# Patient Record
Sex: Female | Born: 1990 | Race: White | Hispanic: No | Marital: Married | Smoking: Never smoker
Health system: Southern US, Community
[De-identification: ages and names within clinical notes are randomized; demographics above are authoritative.]

## PROBLEM LIST (undated history)

## (undated) DIAGNOSIS — Z789 Other specified health status: Secondary | ICD-10-CM

## (undated) DIAGNOSIS — F419 Anxiety disorder, unspecified: Secondary | ICD-10-CM

## (undated) DIAGNOSIS — F909 Attention-deficit hyperactivity disorder, unspecified type: Secondary | ICD-10-CM

## (undated) HISTORY — PX: CYSTECTOMY: SUR359

## (undated) HISTORY — PX: BREAST SURGERY: SHX581

## (undated) HISTORY — DX: Anxiety disorder, unspecified: F41.9

## (undated) HISTORY — PX: WISDOM TOOTH EXTRACTION: SHX21

## (undated) HISTORY — PX: COSMETIC SURGERY: SHX468

## (undated) HISTORY — DX: Other specified health status: Z78.9

## (undated) HISTORY — PX: LASIK: SHX215

---

## 2015-12-20 LAB — OB RESULTS CONSOLE GC/CHLAMYDIA
Chlamydia: NEGATIVE
Gonorrhea: NEGATIVE

## 2015-12-20 LAB — OB RESULTS CONSOLE ABO/RH: RH TYPE: NEGATIVE

## 2015-12-20 LAB — OB RESULTS CONSOLE HEPATITIS B SURFACE ANTIGEN: HEP B S AG: NEGATIVE

## 2015-12-20 LAB — OB RESULTS CONSOLE ANTIBODY SCREEN: ANTIBODY SCREEN: NEGATIVE

## 2015-12-20 LAB — OB RESULTS CONSOLE RPR: RPR: NONREACTIVE

## 2015-12-20 LAB — OB RESULTS CONSOLE RUBELLA ANTIBODY, IGM: RUBELLA: IMMUNE

## 2015-12-20 LAB — OB RESULTS CONSOLE HIV ANTIBODY (ROUTINE TESTING): HIV: NONREACTIVE

## 2016-02-09 ENCOUNTER — Inpatient Hospital Stay (HOSPITAL_COMMUNITY)
Admission: AD | Admit: 2016-02-09 | Payer: No Typology Code available for payment source | Source: Ambulatory Visit | Admitting: Obstetrics and Gynecology

## 2016-03-27 LAB — OB RESULTS CONSOLE GBS: STREP GROUP B AG: NEGATIVE

## 2016-04-10 NOTE — L&D Delivery Note (Signed)
Delivery Note At 10:46 PM a healthy female was delivered via Vaginal, Spontaneous Delivery (Presentation: Occiput left ant).  APGAR: 9, 9; weight pending.   Placenta status: complete, 3 Vs.  Cord:  Normal with the following complications: None.  Cord pH: N/A  Anesthesia:   Episiotomy: None Lacerations: 1st degree; Bilateral vulvar Suture Repair: 2.0 3.0 vicryl rapide Est. Blood Loss (mL):  300  Mom to postpartum.  Baby to Couplet care / Skin to Skin.  Marie-Lyne Asiana Benninger 04/22/2016, 11:23 PM

## 2016-04-12 ENCOUNTER — Other Ambulatory Visit: Payer: Self-pay | Admitting: Obstetrics and Gynecology

## 2016-04-17 ENCOUNTER — Telehealth (HOSPITAL_COMMUNITY): Payer: Self-pay | Admitting: *Deleted

## 2016-04-17 ENCOUNTER — Encounter (HOSPITAL_COMMUNITY): Payer: Self-pay | Admitting: *Deleted

## 2016-04-17 NOTE — Telephone Encounter (Signed)
Preadmission screen  

## 2016-04-18 ENCOUNTER — Encounter (HOSPITAL_COMMUNITY): Payer: Self-pay | Admitting: *Deleted

## 2016-04-22 ENCOUNTER — Inpatient Hospital Stay (HOSPITAL_COMMUNITY)
Admission: AD | Admit: 2016-04-22 | Discharge: 2016-04-24 | DRG: 775 | Disposition: A | Discharge status: Home / Self Care | Payer: Managed Care, Other (non HMO) | Source: Ambulatory Visit | Attending: Obstetrics & Gynecology | Admitting: Obstetrics & Gynecology

## 2016-04-22 ENCOUNTER — Inpatient Hospital Stay (HOSPITAL_COMMUNITY): Payer: Managed Care, Other (non HMO) | Admitting: Anesthesiology

## 2016-04-22 ENCOUNTER — Encounter (HOSPITAL_COMMUNITY): Payer: Self-pay | Admitting: *Deleted

## 2016-04-22 DIAGNOSIS — Z3A40 40 weeks gestation of pregnancy: Secondary | ICD-10-CM

## 2016-04-22 DIAGNOSIS — Z3493 Encounter for supervision of normal pregnancy, unspecified, third trimester: Secondary | ICD-10-CM | POA: Diagnosis present

## 2016-04-22 LAB — CBC
HEMATOCRIT: 36.2 % (ref 36.0–46.0)
Hemoglobin: 12.7 g/dL (ref 12.0–15.0)
MCH: 30.4 pg (ref 26.0–34.0)
MCHC: 35.1 g/dL (ref 30.0–36.0)
MCV: 86.6 fL (ref 78.0–100.0)
Platelets: 222 10*3/uL (ref 150–400)
RBC: 4.18 MIL/uL (ref 3.87–5.11)
RDW: 13 % (ref 11.5–15.5)
WBC: 19.3 10*3/uL — ABNORMAL HIGH (ref 4.0–10.5)

## 2016-04-22 MED ORDER — SOD CITRATE-CITRIC ACID 500-334 MG/5ML PO SOLN: 30.0000 mL | ORAL | Status: DC | PRN

## 2016-04-22 MED ORDER — LIDOCAINE HCL (PF) 1 % IJ SOLN
30.0000 mL | INTRAMUSCULAR | Status: DC | PRN
  Administered 2016-04-22: 30 mL via SUBCUTANEOUS
  Filled 2016-04-22: qty 30

## 2016-04-22 MED ORDER — ONDANSETRON HCL 4 MG/2ML IJ SOLN: 4.0000 mg | Freq: Four times a day (QID) | INTRAMUSCULAR | Status: DC | PRN

## 2016-04-22 MED ORDER — OXYTOCIN 40 UNITS IN LACTATED RINGERS INFUSION - SIMPLE MED
1.0000 m[IU]/min | INTRAVENOUS | Status: DC
  Administered 2016-04-22: 2 m[IU]/min via INTRAVENOUS

## 2016-04-22 MED ORDER — IBUPROFEN 600 MG PO TABS
600.0000 mg | ORAL_TABLET | Freq: Four times a day (QID) | ORAL | Status: DC
  Administered 2016-04-23 – 2016-04-24 (×7): 600 mg via ORAL
  Filled 2016-04-22 (×7): qty 1

## 2016-04-22 MED ORDER — LACTATED RINGERS IV SOLN: 500.0000 mL | Freq: Once | INTRAVENOUS | Status: DC

## 2016-04-22 MED ORDER — LACTATED RINGERS IV SOLN
INTRAVENOUS | Status: DC
  Administered 2016-04-22 (×2): via INTRAVENOUS

## 2016-04-22 MED ORDER — LIDOCAINE HCL (PF) 1 % IJ SOLN
INTRAMUSCULAR | Status: DC | PRN
  Administered 2016-04-22 (×2): 5 mL via EPIDURAL

## 2016-04-22 MED ORDER — EPHEDRINE 5 MG/ML INJ: 10.0000 mg | INTRAVENOUS | Status: DC | PRN

## 2016-04-22 MED ORDER — PHENYLEPHRINE 40 MCG/ML (10ML) SYRINGE FOR IV PUSH (FOR BLOOD PRESSURE SUPPORT)
80.0000 ug | PREFILLED_SYRINGE | INTRAVENOUS | Status: DC | PRN
  Filled 2016-04-22: qty 10

## 2016-04-22 MED ORDER — LACTATED RINGERS IV SOLN
500.0000 mL | INTRAVENOUS | Status: DC | PRN
  Administered 2016-04-22: 500 mL via INTRAVENOUS

## 2016-04-22 MED ORDER — DIPHENHYDRAMINE HCL 50 MG/ML IJ SOLN: 12.5000 mg | INTRAMUSCULAR | Status: DC | PRN

## 2016-04-22 MED ORDER — FLEET ENEMA 7-19 GM/118ML RE ENEM: 1.0000 | ENEMA | RECTAL | Status: DC | PRN

## 2016-04-22 MED ORDER — OXYCODONE-ACETAMINOPHEN 5-325 MG PO TABS: 1.0000 | ORAL_TABLET | ORAL | Status: DC | PRN

## 2016-04-22 MED ORDER — FENTANYL 2.5 MCG/ML BUPIVACAINE 1/10 % EPIDURAL INFUSION (WH - ANES)
14.0000 mL/h | INTRAMUSCULAR | Status: DC | PRN
  Administered 2016-04-22 (×2): 14 mL/h via EPIDURAL
  Filled 2016-04-22 (×2): qty 100

## 2016-04-22 MED ORDER — OXYCODONE-ACETAMINOPHEN 5-325 MG PO TABS
2.0000 | ORAL_TABLET | ORAL | Status: DC | PRN
Start: 2016-04-22 — End: 2016-04-23

## 2016-04-22 MED ORDER — TERBUTALINE SULFATE 1 MG/ML IJ SOLN: 0.2500 mg | Freq: Once | INTRAMUSCULAR | Status: DC | PRN

## 2016-04-22 MED ORDER — OXYTOCIN BOLUS FROM INFUSION
500.0000 mL | Freq: Once | INTRAVENOUS | Status: AC
  Administered 2016-04-22: 500 mL via INTRAVENOUS

## 2016-04-22 MED ORDER — OXYTOCIN 40 UNITS IN LACTATED RINGERS INFUSION - SIMPLE MED
2.5000 [IU]/h | INTRAVENOUS | Status: DC
  Filled 2016-04-22: qty 1000

## 2016-04-22 MED ORDER — ACETAMINOPHEN 325 MG PO TABS: 650.0000 mg | ORAL_TABLET | ORAL | Status: DC | PRN

## 2016-04-22 NOTE — H&P (Signed)
Loretha BrasilMakayla Tolles is a 26 y.o. female G2P0010 3470w2d presenting for regular painful UCs.  HPP/HPI:  [facility].  UCs reg, intense.  No AF leak, no vaginal bleeding.  Good FMs.  No PEC Sx.  OB History    Gravida Para Term Preterm AB Living   2 0     1     SAB TAB Ectopic Multiple Live Births     1           Past Medical History:  Diagnosis Date  . Medical history non-contributory    Past Surgical History:  Procedure Laterality Date  . BREAST SURGERY     augmentation  . CYSTECTOMY     from throat  . WISDOM TOOTH EXTRACTION     Family History: family history is not on file. Social History:  reports that she has never smoked. She has never used smokeless tobacco. She reports that she does not drink alcohol or use drugs.  No Known Allergies  Dilation: (P) 6 Effacement (%): (P) 100 Station: (P) -1 Exam by:: (P) Pheonix Wisby  Left Occiput post AROM AF clear  Blood pressure 109/69, pulse 84, temperature 98.4 F (36.9 C), temperature source Axillary, resp. rate 18, height 5\' 6"  (1.676 m), weight 228 lb 0.6 oz (103.4 kg), last menstrual period 07/15/2015, SpO2 100 %. Exam Physical Exam   FHR 130's with good variability, many accelerations, no deceleration. UCs q2-3 min   HPP:  Patient Active Problem List   Diagnosis Date Noted  . Indication for care in labor or delivery 04/22/2016    Prenatal labs: ABO, Rh: O/Negative/-- (09/11 0000) Antibody: Negative (09/11 0000) Rubella: Immune RPR: Nonreactive (09/11 0000)  HBsAg: Negative (09/11 0000)  HIV: Non-reactive (09/11 0000)  Genetic testing:  Declined US anato: wnl, female fetus 1 hr GTT: wnl GBS: Negative (12/18 0000)   Assessment/Plan: 40+ wks G2P0A1 in active labor.  FHR Cat 1.  Comfortable under Epidural. Expectant management towards probable vaginal delivery.   Marie-Lyne Luwanda Starr 04/22/2016, 4:15 PM

## 2016-04-22 NOTE — Anesthesia Pain Management Evaluation Note (Signed)
  CRNA Pain Management Visit Note  Patient: Rachel Oliver, 26 y.o., female  "Hello I am a member of the anesthesia team at [facility]. We have an anesthesia team available at all times to provide care throughout the hospital, including epidural management and anesthesia for C-section. I don't know your plan for the delivery whether it a natural birth, water birth, IV sedation, nitrous supplementation, doula or epidural, but we want to meet your pain goals."   1.Was your pain managed to your expectations on prior hospitalizations?   No prior hospitalizations  2.What is your expectation for pain management during this hospitalization?     Epidural  3.How can we help you reach that goal? Epidural in place  Record the patient's initial score and the patient's pain goal.   Pain: 1  Pain Goal: 8 [facility] wants you to be able to say your pain was always managed very well.  Pressley Barsky 04/22/2016

## 2016-04-22 NOTE — Anesthesia Preprocedure Evaluation (Signed)
Anesthesia Evaluation  Patient identified by MRN, date of birth, ID band Patient awake    Reviewed: Allergy & Precautions, NPO status , Patient's Chart, lab work & pertinent test results  History of Anesthesia Complications Negative for: history of anesthetic complications  Airway Mallampati: II  TM Distance: >3 FB Neck ROM: Full    Dental no notable dental hx. (+) Dental Advisory Given   Pulmonary neg pulmonary ROS,    Pulmonary exam normal        Cardiovascular negative cardio ROS Normal cardiovascular exam     Neuro/Psych negative neurological ROS  negative psych ROS   GI/Hepatic negative GI ROS, Neg liver ROS,   Endo/Other  negative endocrine ROS  Renal/GU negative Renal ROS  negative genitourinary   Musculoskeletal negative musculoskeletal ROS (+)   Abdominal   Peds negative pediatric ROS (+)  Hematology negative hematology ROS (+)   Anesthesia Other Findings   Reproductive/Obstetrics (+) Pregnancy                             Anesthesia Physical Anesthesia Plan  ASA: II  Anesthesia Plan: Epidural   Post-op Pain Management:    Induction:   Airway Management Planned: Natural Airway and Simple Face Mask  Additional Equipment:   Intra-op Plan:   Post-operative Plan:   Informed Consent: I have reviewed the patients History and Physical, chart, labs and discussed the procedure including the risks, benefits and alternatives for the proposed anesthesia with the patient or authorized representative who has indicated his/her understanding and acceptance.   Dental advisory given  Plan Discussed with: Anesthesiologist  Anesthesia Plan Comments:         Anesthesia Quick Evaluation

## 2016-04-22 NOTE — MAU Note (Signed)
Patient presents with contractions all day was 3 cm on Tuesday.

## 2016-04-22 NOTE — Anesthesia Procedure Notes (Signed)
Epidural Patient location during procedure: OB Start time: 04/22/2016 3:26 PM End time: 04/22/2016 3:36 PM  Staffing Anesthesiologist: Heather RobertsSINGER, Angelic Schnelle Performed: anesthesiologist   Preanesthetic Checklist Completed: patient identified, site marked, pre-op evaluation, timeout performed, IV checked, risks and benefits discussed and monitors and equipment checked  Epidural Patient position: sitting Prep: DuraPrep Patient monitoring: heart rate, cardiac monitor, continuous pulse ox and blood pressure Approach: midline Location: L2-L3 Injection technique: LOR saline  Needle:  Needle type: Tuohy  Needle gauge: 17 G Needle length: 9 cm Needle insertion depth: 7 cm Catheter size: 20 Guage Catheter at skin depth: 12 cm Test dose: negative and Other  Assessment Events: blood not aspirated, injection not painful, no injection resistance and negative IV test  Additional Notes Informed consent obtained prior to proceeding including risk of failure, 1% risk of PDPH, risk of minor discomfort and bruising.  Discussed rare but serious complications including epidural abscess, permanent nerve injury, epidural hematoma.  Discussed alternatives to epidural analgesia and patient desires to proceed.  Timeout performed pre-procedure verifying patient name, procedure, and platelet count.  Patient tolerated procedure well.

## 2016-04-23 ENCOUNTER — Inpatient Hospital Stay (HOSPITAL_COMMUNITY): Admission: RE | Admit: 2016-04-23 | Payer: No Typology Code available for payment source | Source: Ambulatory Visit

## 2016-04-23 ENCOUNTER — Encounter (HOSPITAL_COMMUNITY): Payer: Self-pay

## 2016-04-23 LAB — CBC
HEMATOCRIT: 32.5 % — AB (ref 36.0–46.0)
Hemoglobin: 11.3 g/dL — ABNORMAL LOW (ref 12.0–15.0)
MCH: 30.3 pg (ref 26.0–34.0)
MCHC: 34.8 g/dL (ref 30.0–36.0)
MCV: 87.1 fL (ref 78.0–100.0)
Platelets: 213 10*3/uL (ref 150–400)
RBC: 3.73 MIL/uL — ABNORMAL LOW (ref 3.87–5.11)
RDW: 13.1 % (ref 11.5–15.5)
WBC: 27.1 10*3/uL — ABNORMAL HIGH (ref 4.0–10.5)

## 2016-04-23 LAB — RPR: RPR: NONREACTIVE

## 2016-04-23 MED ORDER — ZOLPIDEM TARTRATE 5 MG PO TABS: 5.0000 mg | ORAL_TABLET | Freq: Every evening | ORAL | Status: DC | PRN

## 2016-04-23 MED ORDER — SENNOSIDES-DOCUSATE SODIUM 8.6-50 MG PO TABS
2.0000 | ORAL_TABLET | ORAL | Status: DC
  Administered 2016-04-23 (×2): 2 via ORAL
  Filled 2016-04-23 (×2): qty 2

## 2016-04-23 MED ORDER — WITCH HAZEL-GLYCERIN EX PADS
1.0000 "application " | MEDICATED_PAD | CUTANEOUS | Status: DC | PRN
  Administered 2016-04-23: 1 via TOPICAL

## 2016-04-23 MED ORDER — PRENATAL MULTIVITAMIN CH
1.0000 | ORAL_TABLET | Freq: Every day | ORAL | Status: DC
  Administered 2016-04-23 – 2016-04-24 (×2): 1 via ORAL
  Filled 2016-04-23 (×2): qty 1

## 2016-04-23 MED ORDER — BENZOCAINE-MENTHOL 20-0.5 % EX AERO
1.0000 "application " | INHALATION_SPRAY | CUTANEOUS | Status: DC | PRN
  Administered 2016-04-23: 1 via TOPICAL
  Filled 2016-04-23 (×2): qty 56

## 2016-04-23 MED ORDER — TETANUS-DIPHTH-ACELL PERTUSSIS 5-2.5-18.5 LF-MCG/0.5 IM SUSP: 0.5000 mL | Freq: Once | INTRAMUSCULAR | Status: DC

## 2016-04-23 MED ORDER — COCONUT OIL OIL: 1.0000 "application " | TOPICAL_OIL | Status: DC | PRN

## 2016-04-23 MED ORDER — RHO D IMMUNE GLOBULIN 1500 UNIT/2ML IJ SOSY
300.0000 ug | PREFILLED_SYRINGE | Freq: Once | INTRAMUSCULAR | Status: DC
  Filled 2016-04-23: qty 2

## 2016-04-23 MED ORDER — DIPHENHYDRAMINE HCL 25 MG PO CAPS: 25.0000 mg | ORAL_CAPSULE | Freq: Four times a day (QID) | ORAL | Status: DC | PRN

## 2016-04-23 MED ORDER — DIBUCAINE 1 % RE OINT: 1.0000 "application " | TOPICAL_OINTMENT | RECTAL | Status: DC | PRN

## 2016-04-23 MED ORDER — OXYTOCIN 40 UNITS IN LACTATED RINGERS INFUSION - SIMPLE MED: 2.5000 [IU]/h | INTRAVENOUS | Status: DC | PRN

## 2016-04-23 MED ORDER — ACETAMINOPHEN 325 MG PO TABS
650.0000 mg | ORAL_TABLET | ORAL | Status: DC | PRN
  Administered 2016-04-23: 650 mg via ORAL
  Filled 2016-04-23: qty 2

## 2016-04-23 MED ORDER — ONDANSETRON HCL 4 MG/2ML IJ SOLN
4.0000 mg | INTRAMUSCULAR | Status: DC | PRN
Start: 2016-04-23 — End: 2016-04-24

## 2016-04-23 MED ORDER — SIMETHICONE 80 MG PO CHEW: 80.0000 mg | CHEWABLE_TABLET | ORAL | Status: DC | PRN

## 2016-04-23 MED ORDER — ONDANSETRON HCL 4 MG PO TABS: 4.0000 mg | ORAL_TABLET | ORAL | Status: DC | PRN

## 2016-04-23 MED ORDER — INFLUENZA VAC SPLIT QUAD 0.5 ML IM SUSY
0.5000 mL | PREFILLED_SYRINGE | INTRAMUSCULAR | Status: AC
  Administered 2016-04-24: 0.5 mL via INTRAMUSCULAR

## 2016-04-23 NOTE — Progress Notes (Signed)
Patient ID: Rachel BrasilMakayla Oliver, female   DOB: 12/05/1990, 26 y.o.   MRN: [id number] PPD # 1 SVD with 1st Degree Perineal Laceration  S:  Reports feeling well.             Tolerating po/ No nausea or vomiting             Bleeding is light             Pain controlled with ibuprofen (OTC)             Up ad lib / ambulatory / voiding without difficulties     Information for the patient's newborn:  Malka SoHall, Boy Malini [[id number]][[id number]]  female "Carsten"  breast feeding  / Circumcision planning   O:  A & O x 3, in no apparent distress              VS:  Vitals:   04/23/16 0000 04/23/16 0025 04/23/16 0126 04/23/16 0534  BP: (!) 126/45 (!) 116/57 126/61 (!) 117/58  Pulse: 84 77 88 69  Resp:  18 18 18   Temp:  98.3 F (36.8 C) 98 F (36.7 C) 97.6 F (36.4 C)  TempSrc:  Oral Oral Oral  SpO2:  98% 97% 100%  Weight:      Height:        LABS:  Recent Labs  04/22/16 1448 04/23/16 0515  WBC 19.3* 27.1*  HGB 12.7 11.3*  HCT 36.2 32.5*  PLT 222 213    Blood type: O/Negative/-- (09/11 0000)  Rubella: Immune (09/11 0000)   I&O: I/O last 3 completed shifts: In: -  Out: 600 [Urine:300; Blood:300]          No intake/output data recorded.    Abdomen: soft, non-tender, non-distended             Fundus: firm, non-tender, U-1  Perineum: 1st degree repair healing well, mild edema  Lochia: minimal  Extremities: No edema, no calf pain or tenderness    A/P: PPD # 1 26 y.o., [id number]   Principal Problem:   Postpartum care following vaginal delivery (1/13) Active Problems:   Indication for care in labor or delivery   Postpartum state   First degree perineal laceration during delivery    Doing well - stable status  Routine post partum orders  Anticipate discharge tomorrow    Raelyn MoraAWSON, Jalene Lacko, M, MSN, CNM 04/23/2016, 9:53 AM

## 2016-04-23 NOTE — Lactation Note (Signed)
This note was copied from a baby's chart. Lactation Consultation Note; Initial visit with mom. She reports baby has had a couple of feedings- did hurt some with latch. Reports she is able to hand express Colostrum and did spoon feed early this morning. Baby asleep in visitors arms. Several visitors present.  Reviewed basic teaching.  Encouraged to page for assist prn. Bf brochure given. Reviewed our phone number, OP appointments and BFSG as resources for support after DC. No questions at present  Patient Name: Rachel Loretha BrasilMakayla Shock [id number]'s Date: 04/23/2016 Reason for consult: Initial assessment   Maternal Data Formula Feeding for Exclusion: No Has patient been taught Hand Expression?: Yes Does the patient have breastfeeding experience prior to this delivery?: No  Feeding Feeding Type: Breast Fed Length of feed: 15 min  LATCH Score/Interventions Latch: Grasps breast easily, tongue down, lips flanged, rhythmical sucking. Intervention(s): Adjust position;Assist with latch;Breast compression;Breast massage  Audible Swallowing: A few with stimulation Intervention(s): Skin to skin;Hand expression  Type of Nipple: Everted at rest and after stimulation  Comfort (Breast/Nipple): Soft / non-tender     Hold (Positioning): Assistance needed to correctly position infant at breast and maintain latch.  LATCH Score: 8  Lactation Tools Discussed/Used     Consult Status Consult Status: Follow-up Date: 04/24/16 Follow-up type: In-patient    Rachel Oliver, Rachel Oliver D 04/23/2016, 1:16 PM

## 2016-04-23 NOTE — Anesthesia Postprocedure Evaluation (Signed)
Anesthesia Post Note  Patient: Rachel Oliver  Procedure(s) Performed: * No procedures listed *  Patient location during evaluation: Mother Baby Anesthesia Type: Epidural Level of consciousness: awake Pain management: satisfactory to patient Vital Signs Assessment: post-procedure vital signs reviewed and stable Respiratory status: spontaneous breathing Cardiovascular status: stable Anesthetic complications: no        Last Vitals:  Vitals:   04/23/16 0126 04/23/16 0534  BP: 126/61 (!) 117/58  Pulse: 88 69  Resp: 18 18  Temp: 36.7 C 36.4 C    Last Pain:  Vitals:   04/23/16 0715  TempSrc:   PainSc: 0-No pain   Pain Goal: Patients Stated Pain Goal: 0 (04/22/16 1359)               Cephus ShellingBURGER,Rosalynn Sergent

## 2016-04-23 NOTE — Progress Notes (Signed)
CSW acknowledges consult. This Clinical research associatewriter attempted to meet with MOB at bedside to complete assessment for consult regarding hx of depression; however, upon this writer's arrival, MOB had several visitors in the room preventing adequate privacy for assessment to be completed. CSW will attempt to meet with MOB at a later time.   Cosme Jacob, MSW, LCSW-A Clinical Social Worker   [facility]  Office: [phone]

## 2016-04-23 NOTE — Anesthesia Postprocedure Evaluation (Addendum)
Anesthesia Post Note  Patient: Rachel Oliver  Procedure(s) Performed: * No procedures listed *  Patient location during evaluation: Mother Baby Anesthesia Type: Epidural Level of consciousness: awake Pain management: pain level controlled Vital Signs Assessment: post-procedure vital signs reviewed and stable Respiratory status: spontaneous breathing Cardiovascular status: stable Postop Assessment: no headache, no backache, epidural receding, patient able to bend at knees, no signs of nausea or vomiting and adequate PO intake Anesthetic complications: no        Last Vitals:  Vitals:   04/23/16 0534 04/23/16 1146  BP: (!) 117/58 100/67  Pulse: 69 81  Resp: 18 17  Temp: 36.4 C 36.5 C    Last Pain:  Vitals:   04/23/16 1146  TempSrc: Axillary  PainSc: 0-No pain   Pain Goal: Patients Stated Pain Goal: 0 (04/22/16 1359)               MULLINS,JANET

## 2016-04-24 LAB — CBC
HCT: 36.4 % (ref 36.0–46.0)
Hemoglobin: 12.5 g/dL (ref 12.0–15.0)
MCH: 30.3 pg (ref 26.0–34.0)
MCHC: 34.3 g/dL (ref 30.0–36.0)
MCV: 88.3 fL (ref 78.0–100.0)
Platelets: 220 10*3/uL (ref 150–400)
RBC: 4.12 MIL/uL (ref 3.87–5.11)
RDW: 13.4 % (ref 11.5–15.5)
WBC: 18.6 10*3/uL — ABNORMAL HIGH (ref 4.0–10.5)

## 2016-04-24 MED ORDER — IBUPROFEN 600 MG PO TABS: 600.0000 mg | ORAL_TABLET | Freq: Four times a day (QID) | ORAL | 0 refills | Status: DC

## 2016-04-24 NOTE — Progress Notes (Signed)
PPD 2 SVD with 1st degree repair  S:  Reports feeling ok - no fever or URI symptoms              Wants flu shot             Tolerating po/ No nausea or vomiting             Bleeding is light             Pain controlled with motrin             Up ad lib / ambulatory / voiding QS  Newborn breast feeding   O:               VS: BP (!) 125/54 (BP Location: Left Arm)   Pulse (!) 50   Temp 98.2 F (36.8 C) (Oral)   Resp 18   Ht 5\' 6"  (1.676 m)   Wt 103.4 kg (228 lb 0.6 oz)   LMP 07/15/2015   SpO2 100%   Breastfeeding? Unknown   BMI 36.81 kg/m    LABS:              Recent Labs  04/22/16 1448 04/23/16 0515  WBC 19.3* 27.1*  HGB 12.7 11.3*  PLT 222 213               Blood type: O/Negative/-- (09/11 0000) / newborn O negative (rhogam not indicated)  Rubella: Immune (09/11 0000)                            Physical Exam:             Alert and oriented X3  Abdomen: soft, non-tender, non-distended              Fundus: firm, non-tender, Ueven  Perineum: no edema  Lochia: light  Extremities:  No edema, no calf pain or tenderness    A: PPD # 2 SVD with 1st degree repair             Leukocytosis - afebrile  Doing well - stable status  P: Routine post partum orders             Recheck CBC prior to DC             Recommend 1 week prior to flu vaccine - precautions reviewed to prevent flu exposure    Marlinda MikeBAILEY, Rachel Oliver CNM, MSN, [facility] 04/24/2016, 9:01 AM

## 2016-04-24 NOTE — Discharge Summary (Signed)
Obstetric Discharge Summary Reason for Admission: onset of labor Prenatal Procedures: none Intrapartum Procedures: spontaneous vaginal delivery Postpartum Procedures: none Complications-Operative and Postpartum: 1st degree perineal laceration Hemoglobin  Date Value Ref Range Status  04/24/2016 12.5 12.0 - 15.0 g/dL Final   HCT  Date Value Ref Range Status  04/24/2016 36.4 36.0 - 46.0 % Final    Physical Exam:  General: alert, cooperative and no distress Lochia: appropriate Uterine Fundus: firm Incision: healing well DVT Evaluation: No evidence of DVT seen on physical exam.  Discharge Diagnoses: Term Pregnancy-delivered  Discharge Information: Date: 04/24/2016 Activity: pelvic rest Diet: routine Medications: PNV and Ibuprofen Condition: stable Instructions: refer to practice specific booklet Discharge to: home Follow-up Information    Genia DelMarie-Lyne Lavoie, MD. Schedule an appointment as soon as possible for a visit in 6 week(s).   Specialty:  Obstetrics and Gynecology Contact information: [address] [place] [address] [phone]           Newborn Data: Live born female  Birth Weight: 7 lb 6.5 oz (3359 g) APGAR: 9, 9  Home with mother.  Marlinda MikeBAILEY, Rachel Serpas 04/24/2016, 12:05 PM

## 2016-04-24 NOTE — Progress Notes (Signed)
  CLINICAL SOCIAL WORK MATERNAL/CHILD NOTE  Patient Details  Name: Rachel Oliver MRN: [id number] Date of Birth: 04/22/2016  Date:  04/24/2016  Clinical Social Worker Initiating Note:  Armandina Iman, LCSW Date/ Time Initiated:  04/24/16/1030     Child's Name:  Rachel Oliver   Legal Guardian:  Other (Comment) (Parents: Rachel Oliver and Rachel Oliver)   Need for Interpreter:  None   Date of Referral:  04/23/16     Reason for Referral:  Other (Comment) (Hx of Depression)   Referral Source:  Central Nursery   Address:  [address], [place], [place] [address]  Phone number:  [phone]   Household Members:  Spouse, Minor Children (FOB has a 12 year old daughter who lives with the couple 50% of the time.)   Natural Supports (not living in the home):   (Parents report a good support system)   Professional Supports: None   Employment:     Type of Work:     Education:      Financial Resources:  Private Insurance   Other Resources:      Cultural/Religious Considerations Which May Impact Care: None stated.  Strengths:  Ability to meet basic needs , Pediatrician chosen , Home prepared for child  ([facility])   Risk Factors/Current Problems:  None   Cognitive State:  Able to Concentrate , Alert , Linear Thinking    Mood/Affect:  Comfortable , Calm , Interested , Euthymic    CSW Assessment: CSW met with parents in MOB's first floor room/145 to offer support and complete assessment due to hx of Depression noted in MOB's PNR.  Parents were pleasant and receptive to CSW's visit. MOB reports that this is her first baby and FOB states he has a 26 year old and 26 year old.  He has joint custody of his 26 year old with her mother.  MOB reports that she and baby are doing well.  She states labor and delivery was easier than she had expected.   MOB reports she is feeling well emotionally both now and throughout her pregnancy.  She denies any hx of mental illness.  CSW  provided education regarding signs and symptoms of PMADs and encouraged her to speak with a medical provider if she has concerns about her emotions at any time.  MOB was understanding and agreeable.  CSW provided information about [facility] groups [facility] as well as a "New Mom Checklist" as a self-evaluation tool.   CSW has no social concerns.  Parents thanked CSW for the visit.  CSW Plan/Description:  Information/Referral to Community Resources , Patient/Family Education , No Further Intervention Required/No Barriers to Discharge    Mahogany Torrance Elizabeth, LCSW 04/24/2016, 12:47 PM  

## 2016-04-24 NOTE — Lactation Note (Signed)
This note was copied from a baby's chart. Lactation Consultation Note  Patient Name: Boy Loretha BrasilMakayla Mccallister ZOXWR'UToday's Date: 04/24/2016 Reason for consult: Follow-up assessment Baby is 5835 hours old, baby arrived back in moms room - post circ, awake, fussy. Diaper dry.  Per mom baby's  Last good feeding was last evening. Awake during the night, would not stay latched  And ended up spoon feeding.  LC reviewed doc flow sheets - voids and stools QS. Bili at 25 hours - 4.4. Breast fed more during the days and evening.  Otherwise spoon fed.  Per mom breast are feeling fuller and heavier. LC reassured mom that is a good sign, and at latch mom able to hand express  Easily. Areola full with some edema. LC recommended prior to latching on the 1st breast - breast massage, hand express, pre - pump  With hand pump to make the nipple / areola complex more elastic for a deeper latch then reverse pressure. LC showed mom How to do the reverse pressure exercise. LC stressed the importance of getting the tissue compressible enough for a deep latch.  Nipples both are pinky red, no breakdown , with a shadow of bruising on the right nipple. LC reviewed shells, hand pump and checked flange ,  And #24 Flange a good fit for today. #27 Flange provided for when the milk comes in . Per mom has a DEBP Spectra. Storage of breast milk page 36 in booklet.  Sore nipple and engorgement prevention and tx reviewed.  Mother informed of post-discharge support and given phone number to the lactation department, including services for phone call assistance; out-patient appointments; and breastfeeding support group. List of other breastfeeding resources in the community given in the handout. Encouraged mother to call for problems or concerns related to breastfeeding.  Maternal Data    Feeding Feeding Type: Breast Fed Length of feed: 8 min (multiple swallows, baby  on/ off - swelling / let down)  LATCH Score/Interventions Latch: Grasps  breast easily, tongue down, lips flanged, rhythmical sucking. Intervention(s): Adjust position;Assist with latch;Breast massage;Breast compression  Audible Swallowing: Spontaneous and intermittent  Type of Nipple: Everted at rest and after stimulation  Comfort (Breast/Nipple): Filling, red/small blisters or bruises, mild/mod discomfort     Hold (Positioning): Assistance needed to correctly position infant at breast and maintain latch. Intervention(s): Breastfeeding basics reviewed;Support Pillows;Position options;Skin to skin  LATCH Score: 8  Lactation Tools Discussed/Used     Consult Status Consult Status: Follow-up Date: 04/24/16 Follow-up type: In-patient    Matilde SprangMargaret Ann Donyel Castagnola 04/24/2016, 10:31 AM

## 2016-04-26 LAB — TYPE AND SCREEN
ABO/RH(D): O NEG
ANTIBODY SCREEN: POSITIVE
DAT, IGG: NEGATIVE
UNIT DIVISION: 0
Unit division: 0

## 2016-08-04 ENCOUNTER — Emergency Department (HOSPITAL_BASED_OUTPATIENT_CLINIC_OR_DEPARTMENT_OTHER)
Admission: EM | Admit: 2016-08-04 | Discharge: 2016-08-04 | Disposition: A | Discharge status: Home / Self Care | Payer: Managed Care, Other (non HMO) | Attending: Emergency Medicine | Admitting: Emergency Medicine

## 2016-08-04 ENCOUNTER — Encounter (HOSPITAL_BASED_OUTPATIENT_CLINIC_OR_DEPARTMENT_OTHER): Payer: Self-pay | Admitting: Emergency Medicine

## 2016-08-04 ENCOUNTER — Emergency Department (HOSPITAL_BASED_OUTPATIENT_CLINIC_OR_DEPARTMENT_OTHER): Payer: Managed Care, Other (non HMO)

## 2016-08-04 DIAGNOSIS — Z791 Long term (current) use of non-steroidal anti-inflammatories (NSAID): Secondary | ICD-10-CM | POA: Insufficient documentation

## 2016-08-04 DIAGNOSIS — Y92481 Parking lot as the place of occurrence of the external cause: Secondary | ICD-10-CM | POA: Diagnosis not present

## 2016-08-04 DIAGNOSIS — M25571 Pain in right ankle and joints of right foot: Secondary | ICD-10-CM | POA: Diagnosis not present

## 2016-08-04 DIAGNOSIS — M25531 Pain in right wrist: Secondary | ICD-10-CM | POA: Diagnosis not present

## 2016-08-04 DIAGNOSIS — Y9389 Activity, other specified: Secondary | ICD-10-CM | POA: Diagnosis not present

## 2016-08-04 DIAGNOSIS — M545 Low back pain: Secondary | ICD-10-CM | POA: Diagnosis not present

## 2016-08-04 DIAGNOSIS — S6991XA Unspecified injury of right wrist, hand and finger(s), initial encounter: Secondary | ICD-10-CM | POA: Diagnosis present

## 2016-08-04 DIAGNOSIS — Y999 Unspecified external cause status: Secondary | ICD-10-CM | POA: Insufficient documentation

## 2016-08-04 DIAGNOSIS — M25562 Pain in left knee: Secondary | ICD-10-CM | POA: Insufficient documentation

## 2016-08-04 LAB — PREGNANCY, URINE: Preg Test, Ur: NEGATIVE

## 2016-08-04 MED ORDER — TETANUS-DIPHTH-ACELL PERTUSSIS 5-2.5-18.5 LF-MCG/0.5 IM SUSP: 0.5000 mL | Freq: Once | INTRAMUSCULAR | Status: DC
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
## 2016-08-04 NOTE — Discharge Instructions (Signed)
As discussed, you may use ibuprofen for pain as directed by your OB/GYN. Heat/ice as needed. Follow up with your primary care provider. Keep abrasions  clean and you may use neosporin.  Return to the emergency department if you experience worsening pain, shortness of breath, chest pain, nausea, vomiting, severe headache, numbness or any other new concerning symptoms.

## 2016-08-04 NOTE — ED Notes (Signed)
ED Provider at bedside. 

## 2016-09-09 NOTE — Addendum Note (Signed)
Addendum  created 09/09/16 0811 by Davien Malone, MD   Sign clinical note    

## 2017-04-06 LAB — OB RESULTS CONSOLE ABO/RH: RH Type: NEGATIVE

## 2017-04-06 LAB — OB RESULTS CONSOLE HEPATITIS B SURFACE ANTIGEN: Hepatitis B Surface Ag: NEGATIVE

## 2017-04-06 LAB — OB RESULTS CONSOLE ANTIBODY SCREEN: Antibody Screen: NEGATIVE

## 2017-04-06 LAB — OB RESULTS CONSOLE HIV ANTIBODY (ROUTINE TESTING): HIV: NONREACTIVE

## 2017-04-06 LAB — OB RESULTS CONSOLE RUBELLA ANTIBODY, IGM: RUBELLA: IMMUNE

## 2017-04-06 LAB — OB RESULTS CONSOLE RPR: RPR: NONREACTIVE

## 2017-04-10 NOTE — L&D Delivery Note (Signed)
Delivery Note At 1:14 PM a viable and healthy female was delivered via Vaginal, Spontaneous (Presentation: LOA ).  APGAR: 8, 9; weight  pending.   Placenta status: spontaneous, intact.  Cord:  with the following complications: Tight [place] x one cut on perineum.  Cord pH: na  Anesthesia:  epidural Episiotomy: None Lacerations: 2nd degree Suture Repair: 2.0 vicryl rapide Est. Blood Loss (mL): 350  Mom to postpartum.  Baby to Couplet care / Skin to Skin.  Mansel Strother J 10/31/2017, 1:40 PM

## 2017-06-14 LAB — OB RESULTS CONSOLE GC/CHLAMYDIA
Chlamydia: NEGATIVE
GC PROBE AMP, GENITAL: NEGATIVE

## 2017-10-10 LAB — OB RESULTS CONSOLE GBS: GBS: NEGATIVE

## 2017-10-31 ENCOUNTER — Inpatient Hospital Stay (HOSPITAL_COMMUNITY)
Admission: AD | Admit: 2017-10-31 | Discharge: 2017-11-01 | DRG: 807 | Disposition: A | Discharge status: Home / Self Care | Payer: Managed Care, Other (non HMO) | Attending: Obstetrics and Gynecology | Admitting: Obstetrics and Gynecology

## 2017-10-31 ENCOUNTER — Inpatient Hospital Stay (HOSPITAL_COMMUNITY): Payer: Managed Care, Other (non HMO) | Admitting: Anesthesiology

## 2017-10-31 ENCOUNTER — Other Ambulatory Visit: Payer: Self-pay

## 2017-10-31 ENCOUNTER — Encounter (HOSPITAL_COMMUNITY): Payer: Self-pay | Admitting: *Deleted

## 2017-10-31 DIAGNOSIS — Z3A38 38 weeks gestation of pregnancy: Secondary | ICD-10-CM

## 2017-10-31 DIAGNOSIS — Z3483 Encounter for supervision of other normal pregnancy, third trimester: Secondary | ICD-10-CM | POA: Diagnosis present

## 2017-10-31 LAB — CBC
HCT: 35 % — ABNORMAL LOW (ref 36.0–46.0)
HEMOGLOBIN: 12.1 g/dL (ref 12.0–15.0)
MCH: 30.3 pg (ref 26.0–34.0)
MCHC: 34.6 g/dL (ref 30.0–36.0)
MCV: 87.5 fL (ref 78.0–100.0)
Platelets: 170 10*3/uL (ref 150–400)
RBC: 4 MIL/uL (ref 3.87–5.11)
RDW: 13 % (ref 11.5–15.5)
WBC: 16.4 10*3/uL — ABNORMAL HIGH (ref 4.0–10.5)

## 2017-10-31 MED ORDER — PRENATAL MULTIVITAMIN CH
1.0000 | ORAL_TABLET | Freq: Every day | ORAL | Status: DC
  Administered 2017-11-01: 1 via ORAL
  Filled 2017-10-31: qty 1

## 2017-10-31 MED ORDER — OXYCODONE-ACETAMINOPHEN 5-325 MG PO TABS: 1.0000 | ORAL_TABLET | ORAL | Status: DC | PRN

## 2017-10-31 MED ORDER — OXYTOCIN BOLUS FROM INFUSION
500.0000 mL | Freq: Once | INTRAVENOUS | Status: AC
  Administered 2017-10-31: 500 mL via INTRAVENOUS

## 2017-10-31 MED ORDER — ACETAMINOPHEN 325 MG PO TABS: 650.0000 mg | ORAL_TABLET | ORAL | Status: DC | PRN

## 2017-10-31 MED ORDER — LACTATED RINGERS IV SOLN
500.0000 mL | Freq: Once | INTRAVENOUS | Status: AC
  Administered 2017-10-31: 500 mL via INTRAVENOUS

## 2017-10-31 MED ORDER — FLEET ENEMA 7-19 GM/118ML RE ENEM: 1.0000 | ENEMA | RECTAL | Status: DC | PRN

## 2017-10-31 MED ORDER — TERBUTALINE SULFATE 1 MG/ML IJ SOLN
0.2500 mg | Freq: Once | INTRAMUSCULAR | Status: DC | PRN
  Filled 2017-10-31: qty 1

## 2017-10-31 MED ORDER — BENZOCAINE-MENTHOL 20-0.5 % EX AERO
1.0000 "application " | INHALATION_SPRAY | CUTANEOUS | Status: DC | PRN
  Filled 2017-10-31: qty 56

## 2017-10-31 MED ORDER — DIBUCAINE 1 % RE OINT: 1.0000 "application " | TOPICAL_OINTMENT | RECTAL | Status: DC | PRN

## 2017-10-31 MED ORDER — EPHEDRINE 5 MG/ML INJ
10.0000 mg | INTRAVENOUS | Status: DC | PRN
  Filled 2017-10-31: qty 2

## 2017-10-31 MED ORDER — OXYCODONE-ACETAMINOPHEN 5-325 MG PO TABS: 2.0000 | ORAL_TABLET | ORAL | Status: DC | PRN

## 2017-10-31 MED ORDER — LIDOCAINE HCL (PF) 1 % IJ SOLN
30.0000 mL | INTRAMUSCULAR | Status: DC | PRN
  Filled 2017-10-31: qty 30

## 2017-10-31 MED ORDER — OXYTOCIN 40 UNITS IN LACTATED RINGERS INFUSION - SIMPLE MED: 1.0000 m[IU]/min | INTRAVENOUS | Status: DC

## 2017-10-31 MED ORDER — LACTATED RINGERS IV SOLN: 500.0000 mL | INTRAVENOUS | Status: DC | PRN

## 2017-10-31 MED ORDER — IBUPROFEN 600 MG PO TABS
600.0000 mg | ORAL_TABLET | Freq: Four times a day (QID) | ORAL | Status: DC
  Administered 2017-10-31 – 2017-11-01 (×4): 600 mg via ORAL
  Filled 2017-10-31 (×4): qty 1

## 2017-10-31 MED ORDER — WITCH HAZEL-GLYCERIN EX PADS: 1.0000 "application " | MEDICATED_PAD | CUTANEOUS | Status: DC | PRN

## 2017-10-31 MED ORDER — FENTANYL 2.5 MCG/ML BUPIVACAINE 1/10 % EPIDURAL INFUSION (WH - ANES)
14.0000 mL/h | INTRAMUSCULAR | Status: DC | PRN
  Administered 2017-10-31: 14 mL/h via EPIDURAL
  Filled 2017-10-31: qty 100

## 2017-10-31 MED ORDER — OXYTOCIN 40 UNITS IN LACTATED RINGERS INFUSION - SIMPLE MED
2.5000 [IU]/h | INTRAVENOUS | Status: DC
  Filled 2017-10-31: qty 1000

## 2017-10-31 MED ORDER — LACTATED RINGERS IV SOLN: 500.0000 mL | Freq: Once | INTRAVENOUS | Status: DC

## 2017-10-31 MED ORDER — ZOLPIDEM TARTRATE 5 MG PO TABS: 5.0000 mg | ORAL_TABLET | Freq: Every evening | ORAL | Status: DC | PRN

## 2017-10-31 MED ORDER — TETANUS-DIPHTH-ACELL PERTUSSIS 5-2.5-18.5 LF-MCG/0.5 IM SUSP: 0.5000 mL | Freq: Once | INTRAMUSCULAR | Status: DC

## 2017-10-31 MED ORDER — DIPHENHYDRAMINE HCL 25 MG PO CAPS: 25.0000 mg | ORAL_CAPSULE | Freq: Four times a day (QID) | ORAL | Status: DC | PRN

## 2017-10-31 MED ORDER — DIPHENHYDRAMINE HCL 50 MG/ML IJ SOLN: 12.5000 mg | INTRAMUSCULAR | Status: DC | PRN

## 2017-10-31 MED ORDER — SENNOSIDES-DOCUSATE SODIUM 8.6-50 MG PO TABS
2.0000 | ORAL_TABLET | ORAL | Status: DC
  Administered 2017-11-01: 2 via ORAL
  Filled 2017-10-31: qty 2

## 2017-10-31 MED ORDER — ONDANSETRON HCL 4 MG/2ML IJ SOLN: 4.0000 mg | Freq: Four times a day (QID) | INTRAMUSCULAR | Status: DC | PRN

## 2017-10-31 MED ORDER — ONDANSETRON HCL 4 MG PO TABS: 4.0000 mg | ORAL_TABLET | ORAL | Status: DC | PRN

## 2017-10-31 MED ORDER — SIMETHICONE 80 MG PO CHEW: 80.0000 mg | CHEWABLE_TABLET | ORAL | Status: DC | PRN

## 2017-10-31 MED ORDER — SOD CITRATE-CITRIC ACID 500-334 MG/5ML PO SOLN: 30.0000 mL | ORAL | Status: DC | PRN

## 2017-10-31 MED ORDER — ACETAMINOPHEN 325 MG PO TABS
650.0000 mg | ORAL_TABLET | ORAL | Status: DC | PRN
Start: 2017-10-31 — End: 2017-11-01

## 2017-10-31 MED ORDER — PHENYLEPHRINE 40 MCG/ML (10ML) SYRINGE FOR IV PUSH (FOR BLOOD PRESSURE SUPPORT)
80.0000 ug | PREFILLED_SYRINGE | INTRAVENOUS | Status: DC | PRN
  Filled 2017-10-31: qty 5
  Filled 2017-10-31: qty 10

## 2017-10-31 MED ORDER — METHYLERGONOVINE MALEATE 0.2 MG PO TABS: 0.2000 mg | ORAL_TABLET | ORAL | Status: DC | PRN

## 2017-10-31 MED ORDER — METHYLERGONOVINE MALEATE 0.2 MG/ML IJ SOLN: 0.2000 mg | INTRAMUSCULAR | Status: DC | PRN

## 2017-10-31 MED ORDER — ONDANSETRON HCL 4 MG/2ML IJ SOLN: 4.0000 mg | INTRAMUSCULAR | Status: DC | PRN

## 2017-10-31 MED ORDER — PHENYLEPHRINE 40 MCG/ML (10ML) SYRINGE FOR IV PUSH (FOR BLOOD PRESSURE SUPPORT)
80.0000 ug | PREFILLED_SYRINGE | INTRAVENOUS | Status: DC | PRN
  Filled 2017-10-31: qty 10
  Filled 2017-10-31: qty 5

## 2017-10-31 MED ORDER — COCONUT OIL OIL: 1.0000 "application " | TOPICAL_OIL | Status: DC | PRN

## 2017-10-31 MED ORDER — LACTATED RINGERS IV SOLN
INTRAVENOUS | Status: DC
  Administered 2017-10-31: 10:00:00 via INTRAVENOUS

## 2017-10-31 MED ORDER — LIDOCAINE HCL (PF) 1 % IJ SOLN
INTRAMUSCULAR | Status: DC | PRN
  Administered 2017-10-31 (×2): 5 mL via EPIDURAL

## 2017-10-31 NOTE — Progress Notes (Signed)
Patient ID: Rachel BrasilMakayla Oliver, female   DOB: 06-15-1990, 27 y.o.   MRN: [id number] Rachel Oliver is a 27 y.o. G3P1011 at 5317w4d by LMP admitted for advanced dilation and slow LOF since this AM, sent from office.  Subjective:  Resting in bed, comfortable. Agrees to ROM per MD request.   Objective: Vitals:   10/31/17 0905 10/31/17 0921 10/31/17 1007  BP:  104/66 (!) 95/58  Pulse:  90 88  Resp:  18 18  Temp:  (!) 97.4 F (36.3 C) 98.1 F (36.7 C)  TempSrc:  Oral Oral  Weight: 98 kg (216 lb 1.9 oz)    Height: 5\' 6"  (1.676 m)       FHT:  FHR: 140 bpm, variability: moderate,  accelerations:  Present,  decelerations:  Absent UC:   irregular, every 3-8 minutes SVE:   Dilation: 5.5 Effacement (%): 80 Station: -2 Exam by:: d paul, cnm AROM of hour-glassing membranes w/ clear AF, vertex well applied  Labs:   Recent Labs    10/31/17 0950  WBC 16.4*  HGB 12.1  HCT 35.0*  PLT 170    Assessment / Plan: G3P1 at term, SROM, latent labor, advanced dilation  Labor: AROM augmentation, Pitocin PRN Preeclampsia:  no signs or symptoms of toxicity Fetal Wellbeing:  Category I Pain Control:  Epidural I/D:  GBS neg Anticipated MOD:  NSVD  Neta Mendsaniela C Paul, CNM, MSN 10/31/2017, 11:07 AM

## 2017-10-31 NOTE — Anesthesia Procedure Notes (Signed)
Epidural Patient location during procedure: OB Start time: 10/31/2017 11:30 AM End time: 10/31/2017 11:38 AM  Staffing Anesthesiologist: Achille RichHodierne, Foye Haggart, MD Performed: anesthesiologist   Preanesthetic Checklist Completed: patient identified, site marked, pre-op evaluation, timeout performed, IV checked, risks and benefits discussed and monitors and equipment checked  Epidural Patient position: sitting Prep: DuraPrep Patient monitoring: heart rate, cardiac monitor, continuous pulse ox and blood pressure Approach: midline Location: L2-L3 Injection technique: LOR saline  Needle:  Needle type: Tuohy  Needle gauge: 17 G Needle length: 9 cm Needle insertion depth: 6 cm Catheter type: closed end flexible Catheter size: 19 Gauge Catheter at skin depth: 12 cm Test dose: negative and Other  Assessment Events: blood not aspirated, injection not painful, no injection resistance and negative IV test  Additional Notes Informed consent obtained prior to proceeding including risk of failure, 1% risk of PDPH, risk of minor discomfort and bruising.  Discussed rare but serious complications including epidural abscess, permanent nerve injury, epidural hematoma.  Discussed alternatives to epidural analgesia and patient desires to proceed.  Timeout performed pre-procedure verifying patient name, procedure, and platelet count.  Patient tolerated procedure well. Reason for block:procedure for pain

## 2017-10-31 NOTE — H&P (Signed)
Rachel Oliver is a 27 y.o. female presenting for leaking AF since 0800. OB History    Gravida  3   Para  1   Term  1   Preterm      AB  1   Living  1     SAB      TAB  1   Ectopic      Multiple  0   Live Births  1          Past Medical History:  Diagnosis Date  . First degree perineal laceration during delivery 04/23/2016  . Medical history non-contributory   . Postpartum care following vaginal delivery (1/13) 04/23/2016  . Postpartum care following vaginal delivery (1/14) 04/23/2016   Past Surgical History:  Procedure Laterality Date  . BREAST SURGERY     augmentation  . CYSTECTOMY     from throat  . WISDOM TOOTH EXTRACTION     Family History: family history is not on file. Social History:  reports that she has never smoked. She has never used smokeless tobacco. She reports that she does not drink alcohol or use drugs.     Maternal Diabetes: No Genetic Screening: Normal Maternal Ultrasounds/Referrals: Normal Fetal Ultrasounds or other Referrals:  None Maternal Substance Abuse:  No Significant Maternal Medications:  None Significant Maternal Lab Results:  None Other Comments:  None  Review of Systems  Constitutional: Negative.   All other systems reviewed and are negative.  Maternal Medical History:  Reason for admission: Rupture of membranes.   Contractions: Onset was less than 1 hour ago.   Frequency: irregular.   Perceived severity is mild.    Fetal activity: Perceived fetal activity is normal.   Last perceived fetal movement was within the past hour.    Prenatal complications: no prenatal complications Prenatal Complications - Diabetes: none.      Blood pressure (!) 95/58, pulse 88, temperature 98.1 F (36.7 C), temperature source Oral, resp. rate 18, height 5\' 6"  (1.676 m), weight 98 kg (216 lb 1.9 oz), unknown if currently breastfeeding. Maternal Exam:  Uterine Assessment: Contraction strength is mild.  Contraction frequency is  irregular.   Abdomen: Patient reports no abdominal tenderness. Fetal presentation: vertex  Introitus: Normal vulva. Normal vagina.  Ferning test: positive.  Nitrazine test: positive. Amniotic fluid character: clear.  Pelvis: adequate for delivery.      Physical Exam  Nursing note and vitals reviewed. Constitutional: She is oriented to person, place, and time. She appears well-developed and well-nourished.  HENT:  Head: Normocephalic and atraumatic.  Neck: Normal range of motion. Neck supple.  Cardiovascular: Normal rate and regular rhythm.  Respiratory: Effort normal and breath sounds normal.  GI: Soft. Bowel sounds are normal.  Genitourinary: Vagina normal and uterus normal.  Musculoskeletal: Normal range of motion.  Neurological: She is alert and oriented to person, place, and time. She has normal reflexes.  Skin: Skin is warm and dry.  Psychiatric: She has a normal mood and affect.    Prenatal labs: ABO, Rh: O/Negative/-- (12/28 0000) Antibody: Negative (12/28 0000) Rubella: Immune (12/28 0000) RPR: Nonreactive (12/28 0000)  HBsAg: Negative (12/28 0000)  HIV: Non-reactive (12/28 0000)  GBS: Negative (07/03 0000)   Assessment/Plan: Term IUP SROM Admit, augment prn   Cruzita Lipa J 10/31/2017, 11:04 AM

## 2017-10-31 NOTE — Progress Notes (Signed)

## 2017-10-31 NOTE — Anesthesia Preprocedure Evaluation (Signed)
Anesthesia Evaluation  Patient identified by MRN, date of birth, ID band Patient awake    Reviewed: Allergy & Precautions, H&P , NPO status , Patient's Chart, lab work & pertinent test results  Airway Mallampati: II   Neck ROM: full    Dental   Pulmonary neg pulmonary ROS,    breath sounds clear to auscultation       Cardiovascular negative cardio ROS   Rhythm:regular Rate:Normal     Neuro/Psych    GI/Hepatic   Endo/Other    Renal/GU      Musculoskeletal   Abdominal   Peds  Hematology   Anesthesia Other Findings   Reproductive/Obstetrics (+) Pregnancy                             Anesthesia Physical Anesthesia Plan  ASA: I  Anesthesia Plan: Epidural   Post-op Pain Management:    Induction: Intravenous  PONV Risk Score and Plan: 2 and Treatment may vary due to age or medical condition  Airway Management Planned: Natural Airway  Additional Equipment:   Intra-op Plan:   Post-operative Plan:   Informed Consent: I have reviewed the patients History and Physical, chart, labs and discussed the procedure including the risks, benefits and alternatives for the proposed anesthesia with the patient or authorized representative who has indicated his/her understanding and acceptance.       Plan Discussed with: Anesthesiologist  Anesthesia Plan Comments:         Anesthesia Quick Evaluation  

## 2017-10-31 NOTE — MAU Note (Signed)
Pt sent from Dr. Jorene Minorsaavon's office for admission, SROM @ 0600, clear fluid, SVE 5-6 cm's in MD office.  Pt denies bleeding, C/O back pain.  Reports good fetal movement.

## 2017-11-01 LAB — CBC
HCT: 33.8 % — ABNORMAL LOW (ref 36.0–46.0)
Hemoglobin: 11.7 g/dL — ABNORMAL LOW (ref 12.0–15.0)
MCH: 30.5 pg (ref 26.0–34.0)
MCHC: 34.6 g/dL (ref 30.0–36.0)
MCV: 88.3 fL (ref 78.0–100.0)
PLATELETS: 157 10*3/uL (ref 150–400)
RBC: 3.83 MIL/uL — ABNORMAL LOW (ref 3.87–5.11)
RDW: 13.1 % (ref 11.5–15.5)
WBC: 17.1 10*3/uL — ABNORMAL HIGH (ref 4.0–10.5)

## 2017-11-01 LAB — RPR: RPR Ser Ql: NONREACTIVE

## 2017-11-01 MED ORDER — IBUPROFEN 600 MG PO TABS: 600.0000 mg | ORAL_TABLET | Freq: Four times a day (QID) | ORAL | 0 refills | Status: DC

## 2017-11-01 NOTE — Progress Notes (Signed)
PPD 1 SVD with 2nd degree repair  S:  Reports feeling well - ready to go home             Tolerating po/ No nausea or vomiting             Bleeding is light             Pain controlled with Motrin             Up ad lib / ambulatory / voiding QS  Newborn Breast  O: VS: BP (!) 106/58 (BP Location: Left Arm)   Pulse (!) 55   Temp 97.6 F (36.4 C) (Oral)   Resp 15   Ht 5\' 6"  (1.676 m)   Wt 98 kg (216 lb 1.9 oz)   SpO2 98%   Breastfeeding? Unknown   BMI 34.88 kg/m   LABS:             Recent Labs    10/31/17 0950 11/01/17 0516  WBC 16.4* 17.1*  HGB 12.1 11.7*  PLT 170 157    Blood type: --/--/O NEG (07/24 0950) / newborn RH negative - NO rhogam indicated Rubella: Immune (12/28 0000)                    I&O: Intake/Output      07/24 0701 - 07/25 0700 07/25 0701 - 07/26 0700   Urine (mL/kg/hr) 200    Blood 350    Total Output 550    Net -550                    Physical Exam:             Alert and oriented X3  Abdomen: soft, non-tender, non-distended              Fundus: firm, non-tender, Ueven  Perineum: ice pack in place  Lochia: light  Extremities:  No edema, no calf pain or tenderness  A: PPD # 1 SVD with 2nd degree repair   Doing well - stable status  P: Routine post partum orders  DC home  Marlinda Mikeanya Bailey CNM, MSN, [facility] 11/01/2017, 8:31 AM

## 2017-11-01 NOTE — Anesthesia Postprocedure Evaluation (Signed)
Anesthesia Post Note  Patient: Designer, [name]  Procedure(s) Performed: AN AD HOC LABOR EPIDURAL     Patient location during evaluation: Mother Baby Anesthesia Type: Epidural Level of consciousness: awake and alert Pain management: pain level controlled Respiratory status: spontaneous breathing Cardiovascular status: stable Postop Assessment: no headache, no backache, epidural receding, patient able to bend at knees, no apparent nausea or vomiting, adequate PO intake and able to ambulate    Last Vitals:  Vitals:   10/31/17 2359 11/01/17 0542  BP: (!) 118/58 (!) 106/58  Pulse: 61 (!) 55  Resp: 16 15  Temp: 36.8 C 36.4 C  SpO2:  98%    Last Pain:  Vitals:   11/01/17 0544  TempSrc:   PainSc: 2    Pain Goal: Patients Stated Pain Goal: 2 (11/01/17 0003)               Salome ArntSterling, Logyn Dedominicis Marie

## 2017-11-01 NOTE — Lactation Note (Signed)
This note was copied from a baby's chart. Lactation Consultation Note  Patient Name: Rachel Loretha BrasilMakayla Oliver BJYNW'GToday's Date: 11/01/2017 Reason for consult: Initial assessment;Early term 3837-38.6wks  Visited with P2 Mom of ET baby at 5424 hrs old.  Mom breastfeeding baby in cradle hold without any breast support or support under baby.  Offered to assist with a more comfortable position.  Mom experienced with breastfeeding her first child (who is 1618 months old) for 12 months.    Assisted Mom with pillow support, sitting her more upright, and supporting and sandwiching breast.  Baby able to attain a deep areolar grasp to breast.  Identified multiple swallows.  Mom taught how to use alternate breast compression to increase milk transfer.  Encouraged STS, and cue based feedings, goal of 8-12 feedings per 24 hrs.    Lactation brochure left with Mom.  Mom aware of IP and OP Lactation support available to her.  To call prn for assist.  Maternal Data Formula Feeding for Exclusion: No Has patient been taught Hand Expression?: Yes Does the patient have breastfeeding experience prior to this delivery?: Yes  Feeding Feeding Type: Breast Fed  LATCH Score Latch: Grasps breast easily, tongue down, lips flanged, rhythmical sucking.  Audible Swallowing: Spontaneous and intermittent  Type of Nipple: Everted at rest and after stimulation  Comfort (Breast/Nipple): Soft / non-tender  Hold (Positioning): Assistance needed to correctly position infant at breast and maintain latch.  LATCH Score: 9  Interventions Interventions: Breast feeding basics reviewed;Assisted with latch;Skin to skin;Breast massage;Hand express;Breast compression;Adjust position;Support pillows;Position options  Lactation Tools Discussed/Used WIC Program: No   Consult Status Consult Status: Follow-up Date: 11/02/17 Follow-up type: In-patient    Rachel Oliver, Elmar Antigua E 11/01/2017, 2:14 PM

## 2017-11-02 ENCOUNTER — Ambulatory Visit: Payer: Self-pay

## 2017-11-02 NOTE — Lactation Note (Signed)
This note was copied from a baby's chart. Lactation Consultation Note  Patient Name: Rachel Oliver Today's Date: 11/02/2017   P2, Baby 46 hours. Mother states her nipples are tender. Observed breastfeeding in cross cradle.  Sucks and swallows observed.   Mother is able to hand express before latching. Encouraged depth. Mom encouraged to feed baby 8-12 times/24 hours and with feeding cues.  Reviewed engorgement care and monitoring voids/stools.      Maternal Data    Feeding    LATCH Score                   Interventions    Lactation Tools Discussed/Used     Consult Status      Hardie PulleyBerkelhammer, Jonmichael Beadnell Boschen 11/02/2017, 11:36 AM

## 2017-11-03 LAB — TYPE AND SCREEN
ABO/RH(D): O NEG
Antibody Screen: POSITIVE
UNIT DIVISION: 0
Unit division: 0

## 2017-11-03 LAB — BPAM RBC
Blood Product Expiration Date: 201908212359
Blood Product Expiration Date: 201908212359
Unit Type and Rh: 9500
Unit Type and Rh: 9500

## 2017-11-06 NOTE — Discharge Summary (Signed)
POSTOPERATIVE DISCHARGE SUMMARY:  Patient ID: Rachel Oliver MRN: [id number] DOB/AGE: [birth year] 27 y.o.  Admit date: 10/31/2017 Admission Diagnoses: SROM at 38 weeks  Discharge date: 11/01/2017  Discharge Diagnoses: PPD 2 s/p vaginal delivery with 2nd degree repair  Prenatal history: [id number]   EDC : 11/10/2017, Alternate EDD Entry  [facility]  Primary provider : Taavon Prenatal course uncomplicated  Prenatal Labs: ABO, Rh: --/--/O NEG (07/24 0950) / Rhophylac not indicated/not given - newborn Rh Negative Antibody: POS (07/24 0950) Rubella: Immune (12/28 0000) RPR: Non Reactive (07/24 0950)  HBsAg: Negative (12/28 0000)  HIV: Non-reactive (12/28 0000)  GBS: Negative (07/03 0000)   Medical / Surgical History :  Past medical history:  Past Medical History:  Diagnosis Date  . First degree perineal laceration during delivery 04/23/2016  . Medical history non-contributory   . Postpartum care following vaginal delivery (1/13) 04/23/2016  . Postpartum care following vaginal delivery (1/14) 04/23/2016    Past surgical history:  Past Surgical History:  Procedure Laterality Date  . BREAST SURGERY     augmentation  . CYSTECTOMY     from throat  . WISDOM TOOTH EXTRACTION      Family History: History reviewed. No pertinent family history.  Social History:  reports that she has never smoked. She has never used smokeless tobacco. She reports that she does not drink alcohol or use drugs.  Allergies: Patient has no known allergies.   Current Medications at time of admission:  Prior to Admission medications   Medication Sig Start Date End Date Taking? Authorizing Provider  ibuprofen (ADVIL,MOTRIN) 600 MG tablet Take 1 tablet (600 mg total) by mouth every 6 (six) hours. 11/01/17   Marlinda MikeBailey, Kaulana Brindle, CNM    Intrapartum Course:  Admit for onset of labor with SROM with labor progression to 10 dilation with normal labor curve Pain management: epidural No  complications  Procedures: epidural, SVD / repair of 2nd degree laceration  Female newborn - weight 7 pounds 1.2 oz See operative report for further details APGAR (1 MIN): 8   APGAR (5 MINS): 9    Postoperative / postpartum course:  Uncomplicated with discharge on PPD 2  Discharge Instructions:  Discharged Condition: stable  Activity: pelvic rest and postoperative restrictions x 2   Diet: routine  Medications:  Allergies as of 11/01/2017   No Known Allergies     Medication List    TAKE these medications   ibuprofen 600 MG tablet Commonly known as:  ADVIL,MOTRIN Take 1 tablet (600 mg total) by mouth every 6 (six) hours.       Postpartum Instructions: [facility] discharge booklet - instructions reviewed  Discharge to: Home  Follow up :   [facility] in 6 weeks for routine postpartum visit with Dr Billy Coastaavon                Signed: Marlinda Mikeanya Hero Kulish CNM, MSN, FACNM 11/06/2017, 11:08 AM

## 2018-04-10 NOTE — L&D Delivery Note (Signed)
Delivery Note At 12:39 PM a viable and healthy female was delivered via Vaginal, Spontaneous (Presentation: ROA ).  APGAR: 8, 9; weight pending .   Placenta status: spontaneous,intact .  Cord:  with the following complications: tight [place] x one cut on perineum.  Cord pH: na  Anesthesia:  epidural Episiotomy: None Lacerations:  first Suture Repair: 2.0 vicryl rapide Est. Blood Loss (mL):  200  Mom to postpartum.  Baby to Couplet care / Skin to Skin.  [name] 12/04/2018, 12:50 PM

## 2018-05-14 LAB — OB RESULTS CONSOLE HIV ANTIBODY (ROUTINE TESTING): HIV: NONREACTIVE

## 2018-05-14 LAB — OB RESULTS CONSOLE GC/CHLAMYDIA
Chlamydia: NEGATIVE
Gonorrhea: NEGATIVE

## 2018-05-14 LAB — OB RESULTS CONSOLE HEPATITIS B SURFACE ANTIGEN: Hepatitis B Surface Ag: NEGATIVE

## 2018-05-14 LAB — OB RESULTS CONSOLE ABO/RH: RH Type: NEGATIVE

## 2018-05-14 LAB — OB RESULTS CONSOLE ANTIBODY SCREEN: Antibody Screen: NEGATIVE

## 2018-05-14 LAB — OB RESULTS CONSOLE RUBELLA ANTIBODY, IGM: Rubella: IMMUNE

## 2018-05-14 LAB — OB RESULTS CONSOLE RPR: RPR: NONREACTIVE

## 2018-11-12 LAB — OB RESULTS CONSOLE GBS: GBS: NEGATIVE

## 2018-12-02 ENCOUNTER — Encounter (HOSPITAL_COMMUNITY): Payer: Self-pay | Admitting: *Deleted

## 2018-12-02 ENCOUNTER — Telehealth (HOSPITAL_COMMUNITY): Payer: Self-pay | Admitting: *Deleted

## 2018-12-02 NOTE — Telephone Encounter (Signed)
Preadmission screen  

## 2018-12-03 ENCOUNTER — Other Ambulatory Visit: Payer: Self-pay | Admitting: Obstetrics and Gynecology

## 2018-12-04 ENCOUNTER — Other Ambulatory Visit: Payer: Self-pay

## 2018-12-04 ENCOUNTER — Inpatient Hospital Stay (HOSPITAL_COMMUNITY): Payer: Managed Care, Other (non HMO) | Admitting: Anesthesiology

## 2018-12-04 ENCOUNTER — Encounter (HOSPITAL_COMMUNITY): Payer: Self-pay | Admitting: *Deleted

## 2018-12-04 ENCOUNTER — Inpatient Hospital Stay (HOSPITAL_COMMUNITY): Payer: Managed Care, Other (non HMO)

## 2018-12-04 ENCOUNTER — Inpatient Hospital Stay (HOSPITAL_COMMUNITY)
Admission: AD | Admit: 2018-12-04 | Discharge: 2018-12-05 | DRG: 807 | Disposition: A | Discharge status: Home / Self Care | Payer: Managed Care, Other (non HMO) | Attending: Obstetrics and Gynecology | Admitting: Obstetrics and Gynecology

## 2018-12-04 DIAGNOSIS — O99214 Obesity complicating childbirth: Secondary | ICD-10-CM | POA: Diagnosis present

## 2018-12-04 DIAGNOSIS — Z6791 Unspecified blood type, Rh negative: Secondary | ICD-10-CM | POA: Diagnosis not present

## 2018-12-04 DIAGNOSIS — Z3490 Encounter for supervision of normal pregnancy, unspecified, unspecified trimester: Secondary | ICD-10-CM | POA: Diagnosis present

## 2018-12-04 DIAGNOSIS — D649 Anemia, unspecified: Secondary | ICD-10-CM | POA: Diagnosis present

## 2018-12-04 DIAGNOSIS — O26893 Other specified pregnancy related conditions, third trimester: Secondary | ICD-10-CM | POA: Diagnosis present

## 2018-12-04 DIAGNOSIS — Z0000 Encounter for general adult medical examination without abnormal findings: Secondary | ICD-10-CM | POA: Diagnosis present

## 2018-12-04 DIAGNOSIS — Z3A39 39 weeks gestation of pregnancy: Secondary | ICD-10-CM

## 2018-12-04 DIAGNOSIS — O403XX0 Polyhydramnios, third trimester, not applicable or unspecified: Secondary | ICD-10-CM | POA: Diagnosis present

## 2018-12-04 DIAGNOSIS — E669 Obesity, unspecified: Secondary | ICD-10-CM | POA: Diagnosis present

## 2018-12-04 DIAGNOSIS — Z20828 Contact with and (suspected) exposure to other viral communicable diseases: Secondary | ICD-10-CM | POA: Diagnosis present

## 2018-12-04 DIAGNOSIS — O9902 Anemia complicating childbirth: Secondary | ICD-10-CM | POA: Diagnosis present

## 2018-12-04 LAB — CBC
HCT: 35.5 % — ABNORMAL LOW (ref 36.0–46.0)
Hemoglobin: 11.6 g/dL — ABNORMAL LOW (ref 12.0–15.0)
MCH: 28.5 pg (ref 26.0–34.0)
MCHC: 32.7 g/dL (ref 30.0–36.0)
MCV: 87.2 fL (ref 80.0–100.0)
Platelets: 196 10*3/uL (ref 150–400)
RBC: 4.07 MIL/uL (ref 3.87–5.11)
RDW: 13.3 % (ref 11.5–15.5)
WBC: 13.9 10*3/uL — ABNORMAL HIGH (ref 4.0–10.5)
nRBC: 0 % (ref 0.0–0.2)

## 2018-12-04 LAB — RPR: RPR Ser Ql: NONREACTIVE

## 2018-12-04 LAB — SARS CORONAVIRUS 2 (TAT 6-24 HRS): SARS Coronavirus 2: NEGATIVE

## 2018-12-04 MED ORDER — PHENYLEPHRINE 40 MCG/ML (10ML) SYRINGE FOR IV PUSH (FOR BLOOD PRESSURE SUPPORT): 80.0000 ug | PREFILLED_SYRINGE | INTRAVENOUS | Status: DC | PRN

## 2018-12-04 MED ORDER — WITCH HAZEL-GLYCERIN EX PADS: 1.0000 "application " | MEDICATED_PAD | CUTANEOUS | Status: DC | PRN

## 2018-12-04 MED ORDER — ZOLPIDEM TARTRATE 5 MG PO TABS: 5.0000 mg | ORAL_TABLET | Freq: Every evening | ORAL | Status: DC | PRN

## 2018-12-04 MED ORDER — OXYTOCIN BOLUS FROM INFUSION
500.0000 mL | Freq: Once | INTRAVENOUS | Status: DC
  Administered 2018-12-04: 500 mL via INTRAVENOUS

## 2018-12-04 MED ORDER — OXYTOCIN 40 UNITS IN NORMAL SALINE INFUSION - SIMPLE MED
1.0000 m[IU]/min | INTRAVENOUS | Status: DC
  Administered 2018-12-04: 09:00:00 2 m[IU]/min via INTRAVENOUS
  Filled 2018-12-04: qty 1000

## 2018-12-04 MED ORDER — SENNOSIDES-DOCUSATE SODIUM 8.6-50 MG PO TABS
2.0000 | ORAL_TABLET | ORAL | Status: DC
  Filled 2018-12-04: qty 2

## 2018-12-04 MED ORDER — TETANUS-DIPHTH-ACELL PERTUSSIS 5-2.5-18.5 LF-MCG/0.5 IM SUSP: 0.5000 mL | Freq: Once | INTRAMUSCULAR | Status: DC

## 2018-12-04 MED ORDER — BENZOCAINE-MENTHOL 20-0.5 % EX AERO
1.0000 "application " | INHALATION_SPRAY | CUTANEOUS | Status: DC | PRN
  Filled 2018-12-04: qty 56

## 2018-12-04 MED ORDER — TERBUTALINE SULFATE 1 MG/ML IJ SOLN: 0.2500 mg | Freq: Once | INTRAMUSCULAR | Status: DC | PRN

## 2018-12-04 MED ORDER — PRENATAL MULTIVITAMIN CH
1.0000 | ORAL_TABLET | Freq: Every day | ORAL | Status: DC
  Administered 2018-12-05: 1 via ORAL
  Filled 2018-12-04: qty 1

## 2018-12-04 MED ORDER — SODIUM CHLORIDE (PF) 0.9 % IJ SOLN
INTRAMUSCULAR | Status: DC | PRN
  Administered 2018-12-04: 12 mL/h via EPIDURAL

## 2018-12-04 MED ORDER — SOD CITRATE-CITRIC ACID 500-334 MG/5ML PO SOLN: 30.0000 mL | ORAL | Status: DC | PRN

## 2018-12-04 MED ORDER — LIDOCAINE HCL (PF) 1 % IJ SOLN
INTRAMUSCULAR | Status: DC | PRN
  Administered 2018-12-04: 5 mL via EPIDURAL
  Administered 2018-12-04: 3 mL via EPIDURAL
  Administered 2018-12-04: 2 mL via EPIDURAL

## 2018-12-04 MED ORDER — LACTATED RINGERS IV SOLN: 500.0000 mL | INTRAVENOUS | Status: DC | PRN

## 2018-12-04 MED ORDER — FENTANYL-BUPIVACAINE-NACL 0.5-0.125-0.9 MG/250ML-% EP SOLN
12.0000 mL/h | EPIDURAL | Status: DC | PRN
  Filled 2018-12-04: qty 250

## 2018-12-04 MED ORDER — EPHEDRINE 5 MG/ML INJ: 10.0000 mg | INTRAVENOUS | Status: DC | PRN

## 2018-12-04 MED ORDER — LIDOCAINE HCL (PF) 1 % IJ SOLN: 30.0000 mL | INTRAMUSCULAR | Status: DC | PRN

## 2018-12-04 MED ORDER — LACTATED RINGERS IV SOLN
INTRAVENOUS | Status: DC
  Administered 2018-12-04 (×2): via INTRAVENOUS

## 2018-12-04 MED ORDER — COCONUT OIL OIL: 1.0000 "application " | TOPICAL_OIL | Status: DC | PRN

## 2018-12-04 MED ORDER — OXYCODONE-ACETAMINOPHEN 5-325 MG PO TABS: 1.0000 | ORAL_TABLET | ORAL | Status: DC | PRN

## 2018-12-04 MED ORDER — ONDANSETRON HCL 4 MG/2ML IJ SOLN: 4.0000 mg | Freq: Four times a day (QID) | INTRAMUSCULAR | Status: DC | PRN

## 2018-12-04 MED ORDER — DIBUCAINE (PERIANAL) 1 % EX OINT: 1.0000 "application " | TOPICAL_OINTMENT | CUTANEOUS | Status: DC | PRN

## 2018-12-04 MED ORDER — METHYLERGONOVINE MALEATE 0.2 MG/ML IJ SOLN: 0.2000 mg | INTRAMUSCULAR | Status: DC | PRN

## 2018-12-04 MED ORDER — OXYTOCIN 40 UNITS IN NORMAL SALINE INFUSION - SIMPLE MED: 2.5000 [IU]/h | INTRAVENOUS | Status: DC

## 2018-12-04 MED ORDER — ACETAMINOPHEN 325 MG PO TABS
650.0000 mg | ORAL_TABLET | ORAL | Status: DC | PRN
  Administered 2018-12-05 (×2): 650 mg via ORAL
  Filled 2018-12-04 (×2): qty 2

## 2018-12-04 MED ORDER — IBUPROFEN 600 MG PO TABS
600.0000 mg | ORAL_TABLET | Freq: Four times a day (QID) | ORAL | Status: DC
  Administered 2018-12-04 – 2018-12-05 (×4): 600 mg via ORAL
  Filled 2018-12-04 (×5): qty 1

## 2018-12-04 MED ORDER — METHYLERGONOVINE MALEATE 0.2 MG PO TABS: 0.2000 mg | ORAL_TABLET | ORAL | Status: DC | PRN

## 2018-12-04 MED ORDER — DIPHENHYDRAMINE HCL 50 MG/ML IJ SOLN: 12.5000 mg | INTRAMUSCULAR | Status: DC | PRN

## 2018-12-04 MED ORDER — OXYCODONE-ACETAMINOPHEN 5-325 MG PO TABS: 2.0000 | ORAL_TABLET | ORAL | Status: DC | PRN

## 2018-12-04 MED ORDER — ONDANSETRON HCL 4 MG/2ML IJ SOLN: 4.0000 mg | INTRAMUSCULAR | Status: DC | PRN

## 2018-12-04 MED ORDER — LACTATED RINGERS IV SOLN
500.0000 mL | Freq: Once | INTRAVENOUS | Status: AC
  Administered 2018-12-04: 500 mL via INTRAVENOUS

## 2018-12-04 MED ORDER — SIMETHICONE 80 MG PO CHEW: 80.0000 mg | CHEWABLE_TABLET | ORAL | Status: DC | PRN

## 2018-12-04 MED ORDER — ACETAMINOPHEN 325 MG PO TABS: 650.0000 mg | ORAL_TABLET | ORAL | Status: DC | PRN

## 2018-12-04 MED ORDER — ONDANSETRON HCL 4 MG PO TABS: 4.0000 mg | ORAL_TABLET | ORAL | Status: DC | PRN

## 2018-12-04 MED ORDER — DIPHENHYDRAMINE HCL 25 MG PO CAPS: 25.0000 mg | ORAL_CAPSULE | Freq: Four times a day (QID) | ORAL | Status: DC | PRN

## 2018-12-04 NOTE — Anesthesia Preprocedure Evaluation (Signed)
Anesthesia Evaluation  Patient identified by MRN, date of birth, ID band Patient awake    Reviewed: Allergy & Precautions, NPO status , Patient's Chart, lab work & pertinent test results  Airway Mallampati: II  TM Distance: >3 FB Neck ROM: Full    Dental  (+) Teeth Intact, Dental Advisory Given   Pulmonary neg pulmonary ROS,  Covid pending   Pulmonary exam normal breath sounds clear to auscultation       Cardiovascular negative cardio ROS Normal cardiovascular exam Rhythm:Regular Rate:Normal     Neuro/Psych negative neurological ROS     GI/Hepatic negative GI ROS, Neg liver ROS,   Endo/Other  Obesity   Renal/GU negative Renal ROS     Musculoskeletal negative musculoskeletal ROS (+)   Abdominal   Peds  Hematology  (+) Blood dyscrasia, anemia , Plt 196k   Anesthesia Other Findings Day of surgery medications reviewed with the patient.  Reproductive/Obstetrics (+) Pregnancy                             Anesthesia Physical Anesthesia Plan  ASA: II  Anesthesia Plan: Epidural   Post-op Pain Management:    Induction:   PONV Risk Score and Plan: 2 and Treatment may vary due to age or medical condition  Airway Management Planned: Natural Airway  Additional Equipment:   Intra-op Plan:   Post-operative Plan:   Informed Consent: I have reviewed the patients History and Physical, chart, labs and discussed the procedure including the risks, benefits and alternatives for the proposed anesthesia with the patient or authorized representative who has indicated his/her understanding and acceptance.     Dental advisory given  Plan Discussed with:   Anesthesia Plan Comments: (Patient identified. Risks/Benefits/Options discussed with patient including but not limited to bleeding, infection, nerve damage, paralysis, failed block, incomplete pain control, headache, blood pressure changes,  nausea, vomiting, reactions to medication both or allergic, itching and postpartum back pain. Confirmed with bedside nurse the patient's most recent platelet count. Confirmed with patient that they are not currently taking any anticoagulation, have any bleeding history or any family history of bleeding disorders. Patient expressed understanding and wished to proceed. All questions were answered. )        Anesthesia Quick Evaluation

## 2018-12-04 NOTE — MAU Note (Signed)
Covid swab collected. PT tolerated well.Pt asymptomatic 

## 2018-12-04 NOTE — H&P (Signed)
Rachel Oliver is a 28 y.o. female presenting for IOL for advanced dilatation. OB History    Gravida  4   Para  2   Term  2   Preterm      AB  1   Living  2     SAB      TAB  1   Ectopic      Multiple  0   Live Births  2          Past Medical History:  Diagnosis Date  . First degree perineal laceration during delivery 04/23/2016  . Medical history non-contributory   . Postpartum care following vaginal delivery (1/13) 04/23/2016  . Postpartum care following vaginal delivery (1/14) 04/23/2016   Past Surgical History:  Procedure Laterality Date  . BREAST SURGERY     augmentation  . CYSTECTOMY     from throat  . WISDOM TOOTH EXTRACTION     Family History: family history is not on file. Social History:  reports that she has never smoked. She has never used smokeless tobacco. She reports that she does not drink alcohol or use drugs.     Maternal Diabetes: No Genetic Screening: Normal Maternal Ultrasounds/Referrals: Normal Fetal Ultrasounds or other Referrals:  None Maternal Substance Abuse:  No Significant Maternal Medications:  None Significant Maternal Lab Results:  None Other Comments:  None  Review of Systems  Constitutional: Negative.   All other systems reviewed and are negative.  Maternal Medical History:  Reason for admission: Contractions.   Contractions: Onset was less than 1 hour ago.   Frequency: rare.   Perceived severity is mild.    Fetal activity: Perceived fetal activity is normal.   Last perceived fetal movement was within the past hour.    Prenatal complications: Polyhydramnios.   Prenatal Complications - Diabetes: none.      Blood pressure 114/70, pulse 84, temperature 98 F (36.7 C), temperature source Oral, resp. rate 18, height 5\' 6"  (1.676 m), weight 95.7 kg, unknown if currently breastfeeding. Maternal Exam:  Uterine Assessment: Contraction strength is mild.  Contraction frequency is rare.   Abdomen: Patient reports no  abdominal tenderness. Fetal presentation: vertex  Introitus: Normal vulva. Normal vagina.  Ferning test: not done.  Nitrazine test: not done.  Pelvis: adequate for delivery.   Cervix: Cervix evaluated by digital exam.     Physical Exam  Constitutional: She is oriented to person, place, and time. She appears well-developed and well-nourished.  HENT:  Head: Normocephalic and atraumatic.  Neck: Normal range of motion. Neck supple.  Cardiovascular: Normal rate and regular rhythm.  Respiratory: Effort normal and breath sounds normal.  GI: Soft. Bowel sounds are normal.  Genitourinary:    Vulva, vagina and uterus normal.   Musculoskeletal: Normal range of motion.  Neurological: She is alert and oriented to person, place, and time. She has normal reflexes.  Skin: Skin is warm and dry.  Psychiatric: She has a normal mood and affect.    Prenatal labs: ABO, Rh: pos Antibody: neg Rubella: Immune (02/04 0000) RPR: Nonreactive (02/04 0000)  HBsAg: Negative (02/04 0000)  HIV: Non-reactive (02/04 0000)  GBS: Negative (08/04 0000)   Assessment/Plan: 39 wks IUP Advanced dilatation Proceed with IOL   Rachel Oliver 12/04/2018, 8:56 AM

## 2018-12-04 NOTE — Anesthesia Postprocedure Evaluation (Signed)
Anesthesia Post Note  Patient: Barista  Procedure(s) Performed: AN AD HOC LABOR EPIDURAL     Patient location during evaluation: Mother Baby Anesthesia Type: Epidural Level of consciousness: awake and alert Pain management: pain level controlled Vital Signs Assessment: post-procedure vital signs reviewed and stable Respiratory status: spontaneous breathing, nonlabored ventilation and respiratory function stable Cardiovascular status: stable Postop Assessment: no headache, no backache and epidural receding Anesthetic complications: no    Last Vitals:  Vitals:   12/04/18 1430 12/04/18 1530  BP: 105/73 117/71  Pulse: (!) 54 70  Resp: 18 17  Temp: 36.5 C 36.6 C  SpO2:      Last Pain:  Vitals:   12/04/18 1530  TempSrc: Oral  PainSc: 0-No pain   Pain Goal: Patients Stated Pain Goal: 3 (12/04/18 1208)                 Barkley Boards

## 2018-12-04 NOTE — Anesthesia Procedure Notes (Signed)
Epidural Patient location during procedure: OB Start time: 12/04/2018 8:57 AM End time: 12/04/2018 9:05 AM  Staffing Anesthesiologist: Catalina Gravel, MD Performed: anesthesiologist   Preanesthetic Checklist Completed: patient identified, pre-op evaluation, timeout performed, IV checked, risks and benefits discussed and monitors and equipment checked  Epidural Patient position: sitting Prep: DuraPrep Patient monitoring: blood pressure and continuous pulse ox Approach: midline Location: L3-L4 Injection technique: LOR air  Needle:  Needle type: Tuohy  Needle gauge: 17 G Needle length: 9 cm Needle insertion depth: 6 cm Catheter size: 19 Gauge Catheter at skin depth: 11 cm Test dose: negative and Other (1% Lidocaine)  Additional Notes Patient identified.  Risk benefits discussed including failed block, incomplete pain control, headache, nerve damage, paralysis, blood pressure changes, nausea, vomiting, reactions to medication both toxic or allergic, and postpartum back pain.  Patient expressed understanding and wished to proceed.  All questions were answered.  Sterile technique used throughout procedure and epidural site dressed with sterile barrier dressing. No paresthesia or other complications noted. The patient did not experience any signs of intravascular injection such as tinnitus or metallic taste in mouth nor signs of intrathecal spread such as rapid motor block. Please see nursing notes for vital signs. Reason for block:procedure for pain

## 2018-12-04 NOTE — Anesthesia Postprocedure Evaluation (Signed)
Anesthesia Post Note  Patient: Rachel Oliver  Procedure(s) Performed: AN AD HOC LABOR EPIDURAL     Patient location during evaluation: Mother Baby Anesthesia Type: Epidural Level of consciousness: awake and alert Pain management: pain level controlled Vital Signs Assessment: post-procedure vital signs reviewed and stable Respiratory status: spontaneous breathing, nonlabored ventilation and respiratory function stable Cardiovascular status: stable Postop Assessment: no headache, no backache and epidural receding Anesthetic complications: no    Last Vitals:  Vitals:   12/04/18 1430 12/04/18 1530  BP: 105/73 117/71  Pulse: (!) 54 70  Resp: 18 17  Temp: 36.5 C 36.6 C  SpO2:      Last Pain:  Vitals:   12/04/18 1530  TempSrc: Oral  PainSc: 0-No pain   Pain Goal: Patients Stated Pain Goal: 3 (12/04/18 1208)                 Bless Belshe     

## 2018-12-05 LAB — CBC
HCT: 35.1 % — ABNORMAL LOW (ref 36.0–46.0)
Hemoglobin: 11.4 g/dL — ABNORMAL LOW (ref 12.0–15.0)
MCH: 28.7 pg (ref 26.0–34.0)
MCHC: 32.5 g/dL (ref 30.0–36.0)
MCV: 88.4 fL (ref 80.0–100.0)
Platelets: 182 10*3/uL (ref 150–400)
RBC: 3.97 MIL/uL (ref 3.87–5.11)
RDW: 13.3 % (ref 11.5–15.5)
WBC: 16.2 10*3/uL — ABNORMAL HIGH (ref 4.0–10.5)
nRBC: 0 % (ref 0.0–0.2)

## 2018-12-05 MED ORDER — RHO D IMMUNE GLOBULIN 1500 UNIT/2ML IJ SOSY
300.0000 ug | PREFILLED_SYRINGE | Freq: Once | INTRAMUSCULAR | Status: AC
  Administered 2018-12-05: 300 ug via INTRAVENOUS
  Filled 2018-12-05: qty 2

## 2018-12-05 MED ORDER — IBUPROFEN 600 MG PO TABS: 600.0000 mg | ORAL_TABLET | Freq: Four times a day (QID) | ORAL | 0 refills | Status: DC

## 2018-12-05 MED ORDER — SERTRALINE HCL 50 MG PO TABS: 50.0000 mg | ORAL_TABLET | Freq: Every day | ORAL | 2 refills | Status: DC

## 2018-12-05 NOTE — Progress Notes (Signed)
CSW received consult for history of anxiety and PPD.  CSW met with MOB to offer support and complete assessment.    MOB sitting in bed holding infant with FOB present at bedside, when CSW entered the room. CSW introduced self and received verbal permission from MOB to complete assessment with FOB present. MOB and FOB both very pleasant and engaged throughout assessment. CSW inquired about MOB's mental health history and MOB acknowledged being diagnosed with anxiety and PPD. MOB stated she feels she's always had a little bit of anxiety but stated it got worse following the birth of her first child. MOB described symptoms of constant worrying about something happening to her children. MOB reported main thing that helped with managing her symptoms was Zoloft. MOB stated that her doctor has written her a prescription to start taking. MOB denied any current symptoms and stated this time feels better. CSW provided education regarding the baby blues period vs. perinatal mood disorders, discussed treatment and gave resources for mental health follow up if concerns arise.  CSW recommends self-evaluation during the postpartum time period using the New Mom Checklist from Postpartum Progress and encouraged MOB to contact a medical professional if symptoms are noted at any time. MOB did not appear to be displaying any acute mental health symptoms and denied any current SI or HI. MOB reported primary supports as FOB and her sister-in-law.   MOB confirmed having all essential items for infant once discharged and reported infant would be sleeping in a crib once home. MOB and FOB aware of Sudden Infant Death Syndrome (SIDS) precautions and safe sleeping habits.    CSW identifies no further need for intervention and no barriers to discharge at this time.  Elijio Miles, [place]  [facility] [employer] [phone]

## 2018-12-05 NOTE — Lactation Note (Addendum)
This note was copied from a baby's chart. Lactation Consultation Note  Patient Name: Rachel Oliver Date: 12/05/2018 Reason for consult: Initial assessment  Initial visit at 20 hours of life. Mom is a P3 and experienced. She nursed her 1st 2 children for 1 year & 8 months, respectively. Mom has good vein distribtion on breasts, she affirmed that she had a good milk supply with her previous children. Mom's H&P mentions that she has a hx of breast augmentation.   I helped Mom with infant's alignment to achieve a better latch. Infant latched with ease & Mom expresses transitional milk with ease (her second child is 52 year old & nursed for 8 months).   Infant is 52 hours old yet & has not stooled, yet. I suggested that Mom do breast compressions during feedings to increase frequency of swallows. Mom has been & is comfortable with latch.  Mom was made aware of O/P services and our phone # for post-discharge questions.  Matthias Hughs [facility] 12/05/2018, 9:13 AM

## 2018-12-05 NOTE — Discharge Summary (Signed)
Obstetric Discharge Summary   Patient Name: Rachel Oliver DOB: 1990/06/21 MRN: [id number]  Date of Admission: 12/04/2018 Date of Discharge: 12/05/2018 Date of Delivery: 12/04/2018 Gestational Age at Delivery: [redacted]w[redacted]d  Primary OB: [facility] - Dr. Ronita Hipps  Antepartum complications:  - Polyhydramnios - Hx. Of postpartum depression previously on Zoloft 50mg  daily Prenatal Labs:  ABO, Rh: O Negative Antibody: positive - passively acquired anti-D Rubella: Immune (02/04 0000) RPR: Nonreactive (02/04 0000)  HBsAg: Negative (02/04 0000)  HIV: Non-reactive (02/04 0000)  GBS: Negative (08/04 0000)  Admitting Diagnosis: 39 weeks IOL for advanced dilation   Secondary Diagnoses: Patient Active Problem List   Diagnosis Date Noted  . First degree perineal laceration 12/05/2018  . Encounter for planned induction of labor 12/04/2018  . SVD  10/31/2017  . Postpartum state 04/23/2016  . Postpartum care following vaginal delivery  (8/26) 04/23/2016    Augmentation: Pitocin, AROM Complications: None  Date of Delivery: 12/04/2018 Delivered By: Dr. Ronita Hipps Delivery Type: spontaneous vaginal delivery Anesthesia: epidural Placenta: sponatneous Laceration: first degree perineal  Episiotomy: none  Newborn Data: Live born female  Birth Weight: 7 lb 8.1 oz (3405 g) APGAR: 8, 9  Newborn Delivery   Birth date/time: 12/04/2018 12:39:00 Delivery type: Vaginal, Spontaneous         Hospital/Postpartum Course  Vaginal Delivery): Pt. Admitted for IOL for advanced dilation.  See notes and delivery summary for details. Patient had an uncomplicated postpartum course.  By time of discharge on PPD#1, her pain was controlled on oral pain medications; she had appropriate lochia and was ambulating, voiding without difficulty and tolerating regular diet.  She was deemed stable for discharge to home.     Labs: CBC Latest Ref Rng & Units 12/05/2018 12/04/2018 11/01/2017  WBC 4.0 - 10.5 K/uL 16.2(H) 13.9(H)  17.1(H)  Hemoglobin 12.0 - 15.0 g/dL 11.4(L) 11.6(L) 11.7(L)  Hematocrit 36.0 - 46.0 % 35.1(L) 35.5(L) 33.8(L)  Platelets 150 - 400 K/uL 182 196 157   O NEG  Physical exam:  BP 109/71 (BP Location: Left Arm)   Pulse (!) 59   Temp 97.8 F (36.6 C) (Oral)   Resp 16   Ht 5\' 6"  (1.676 m)   Wt 95.7 kg   SpO2 99%   Breastfeeding Unknown   BMI 34.06 kg/m  General: alert and no distress Pulm: normal respiratory effort Lochia: appropriate Abdomen: soft, NT Uterine Fundus: firm, below umbilicus Perineum: healing well, no significant erythema, no significant edema ncision: c/d/i, healing well, no significant drainage, no dehiscence, no significant erythema Extremities: No evidence of DVT seen on physical exam. No lower extremity edema.   Disposition: stable, discharge to home Baby Feeding: breast milk Baby Disposition: home with mom   Rh Immune globulin given: given 12/05/2018 Rubella vaccine given:  Tdap vaccine given in AP or PP setting: UTD Flu vaccine given in AP or PP setting: not on file   Plan:  Disa Riedlinger was discharged to home in good condition. Follow-up appointment at [facility] in 6 weeks.  Discharge Instructions: Per After Visit Summary. Refer to After Visit Summary and [facility] discharge booklet  Activity: Advance as tolerated. Pelvic rest for 6 weeks.   Diet: Regular, Heart Healthy Discharge Medications: Allergies as of 12/05/2018   No Known Allergies     Medication List    TAKE these medications   ibuprofen 600 MG tablet Commonly known as: ADVIL Take 1 tablet (600 mg total) by mouth every 6 (six) hours.   sertraline 50 MG tablet Commonly  known as: Zoloft Take 1 tablet (50 mg total) by mouth daily.            Discharge Care Instructions  (From admission, onward)         Start     Ordered   12/05/18 0000  Discharge wound care:    Comments: Warm water sitz baths daily as needed for repair   12/05/18 1250         Outpatient  follow up:  Follow-up Information    Olivia Mackieaavon, Richard, MD. Schedule an appointment as soon as possible for a visit in 6 week(s).   Specialty: Obstetrics and Gynecology Why: Postpartum visit Contact information: [address] [place] [address] [phone]           Signed:  Carlean JewsMeredith Ryla Cauthon, MSN, CNM [facility]

## 2018-12-05 NOTE — Progress Notes (Signed)
PPD #1,SVD, 1st degree perineal, baby girl "Addison"  S:  Reports feeling good, minimal discomfort  Reports a hx of PPD - desires to restart Zoloft 50mg ; had been taking, but stopped in April/May             Tolerating po/ No nausea or vomiting / Denies dizziness or SOB             Bleeding is light             Pain controlled with Motrin             Up ad lib / ambulatory / voiding QS  Newborn breast feeding - going really well; better than other 2 babies    O:               VS: BP 109/71 (BP Location: Left Arm)   Pulse (!) 59   Temp 97.8 F (36.6 C) (Oral)   Resp 16   Ht 5\' 6"  (1.676 m)   Wt 95.7 kg   SpO2 99%   Breastfeeding Unknown   BMI 34.06 kg/m    LABS:              Recent Labs    12/04/18 0800 12/05/18 0556  WBC 13.9* 16.2*  HGB 11.6* 11.4*  PLT 196 182               Blood type: --/--/O NEG (08/26 0800)  Rubella: Immune (02/04 0000)                     I&O: Intake/Output      08/26 0701 - 08/27 0700 08/27 0701 - 08/28 0700   I.V. (mL/kg) 75.5 (0.8)    Other 0    Total Intake(mL/kg) 75.5 (0.8)    Blood 100    Total Output 100    Net -24.5                       Physical Exam:             Alert and oriented X3  Lungs: Clear and unlabored  Heart: regular rate and rhythm / no murmurs  Abdomen: soft, non-tender, non-distended              Fundus: firm, non-tender, U-1  Perineum: well approximated 1st degree perineal repair, no edema, no erythema, no ecchymosis   Lochia: scant rubra on pad   Extremities: no edema, no calf pain or tenderness    A: PPD # 1, SVD  1st degree perineal  RH Negative - Baby O Positive  Hx. Of postpartum depression    Doing well - stable status  P: Routine post partum orders  Give Rhogam today   Restart Zoloft 50mg  daily - warning s/s reviewed and possible adverse side effects discussed; pt. Reports she never had any problems with previous medication   Discharge home today  [facility] discharge book given, instructions and  warning s/s reviewed  F/u with Dr. Ronita Hipps in 6 weeks or sooner if developing PPD    Lars Pinks, MSN, CNM [facility]

## 2018-12-06 LAB — TYPE AND SCREEN
ABO/RH(D): O NEG
Antibody Screen: POSITIVE
Unit division: 0
Unit division: 0

## 2018-12-06 LAB — RH IG WORKUP (INCLUDES ABO/RH)
ABO/RH(D): O NEG
Fetal Screen: NEGATIVE
Gestational Age(Wks): 39
Unit division: 0

## 2018-12-06 LAB — BPAM RBC
Blood Product Expiration Date: 202009102359
Blood Product Expiration Date: 202009112359
Unit Type and Rh: 9500
Unit Type and Rh: 9500

## 2019-09-09 ENCOUNTER — Ambulatory Visit: Payer: Managed Care, Other (non HMO) | Attending: Internal Medicine

## 2019-09-09 DIAGNOSIS — Z23 Encounter for immunization: Secondary | ICD-10-CM

## 2019-09-09 NOTE — Progress Notes (Signed)
   Covid-19 Vaccination Clinic  Name:  Chyenne Sobczak    MRN: [id number] DOB: September 26, 1990  09/09/2019  Ms. Vargo was observed post Covid-19 immunization for 15 minutes without incident. She was provided with Vaccine Information Sheet and instruction to access the V-Safe system.   Ms. Daugherty was instructed to call 911 with any severe reactions post vaccine: Marland Kitchen Difficulty breathing  . Swelling of face and throat  . A fast heartbeat  . A bad rash all over body  . Dizziness and weakness   Immunizations Administered    Name Date Dose VIS Date Route   Moderna COVID-19 Vaccine 09/09/2019  1:29 PM 0.5 mL 03/2019 Intramuscular   Manufacturer: Moderna   Lot: [id number]   NDC: 81025-486-28

## 2019-10-07 ENCOUNTER — Ambulatory Visit: Payer: Managed Care, Other (non HMO) | Attending: Internal Medicine

## 2019-10-07 DIAGNOSIS — Z23 Encounter for immunization: Secondary | ICD-10-CM

## 2019-10-07 NOTE — Progress Notes (Signed)
   Covid-19 Vaccination Clinic  Name:  Onia Shiflett    MRN: [id number] DOB: Mar 24, 1991  10/07/2019  Ms. Fitzsimmons was observed post Covid-19 immunization for 15 minutes without incident. She was provided with Vaccine Information Sheet and instruction to access the V-Safe system.   Ms. Bayly was instructed to call 911 with any severe reactions post vaccine: Marland Kitchen Difficulty breathing  . Swelling of face and throat  . A fast heartbeat  . A bad rash all over body  . Dizziness and weakness   Immunizations Administered    Name Date Dose VIS Date Route   Moderna COVID-19 Vaccine 10/07/2019  5:10 PM 0.5 mL 03/2019 Intramuscular   Manufacturer: Moderna   Lot: [id number]   NDC: 20355-974-16

## 2019-10-30 ENCOUNTER — Ambulatory Visit: Payer: Managed Care, Other (non HMO) | Admitting: Podiatry

## 2019-10-30 ENCOUNTER — Encounter: Payer: Self-pay | Admitting: Podiatry

## 2019-10-30 ENCOUNTER — Ambulatory Visit (INDEPENDENT_AMBULATORY_CARE_PROVIDER_SITE_OTHER): Payer: Managed Care, Other (non HMO)

## 2019-10-30 ENCOUNTER — Other Ambulatory Visit: Payer: Self-pay

## 2019-10-30 DIAGNOSIS — M79672 Pain in left foot: Secondary | ICD-10-CM

## 2019-10-30 DIAGNOSIS — M722 Plantar fascial fibromatosis: Secondary | ICD-10-CM | POA: Diagnosis not present

## 2019-10-30 DIAGNOSIS — M79671 Pain in right foot: Secondary | ICD-10-CM | POA: Diagnosis not present

## 2019-10-30 NOTE — Patient Instructions (Signed)

## 2019-11-03 NOTE — Progress Notes (Signed)
Subjective:   Patient ID: Rachel Oliver, female   DOB: 29 y.o.   MRN: [id number]   HPI 29 year old female presents the office today for concerns of bilateral foot pain.  She states that it starts in the heel and goes down the arch of the foot.  Aches more in the way when she first gets up.  She not able to do any jumping or wear flat shoes.  She denies any recent injury or trauma.  This is mildly ongoing for some time.  No other joint issues.  She has no other concerns today.   Review of Systems  All other systems reviewed and are negative.  Past Medical History:  Diagnosis Date  . First degree perineal laceration during delivery 04/23/2016  . Medical history non-contributory   . Postpartum care following vaginal delivery (1/13) 04/23/2016  . Postpartum care following vaginal delivery (1/14) 04/23/2016    Past Surgical History:  Procedure Laterality Date  . BREAST SURGERY     augmentation  . CYSTECTOMY     from throat  . WISDOM TOOTH EXTRACTION       Current Outpatient Medications:  .  ibuprofen (ADVIL) 600 MG tablet, Take 1 tablet (600 mg total) by mouth every 6 (six) hours., Disp: 30 tablet, Rfl: 0 .  sertraline (ZOLOFT) 50 MG tablet, Take 1 tablet (50 mg total) by mouth daily., Disp: 30 tablet, Rfl: 2  No Known Allergies       Objective:  Physical Exam  General: AAO x3, NAD  Dermatological: Skin is warm, dry and supple bilateral. Nails x 10 are well manicured; remaining integument appears unremarkable at this time. There are no open sores, no preulcerative lesions, no rash or signs of infection present.  Vascular: Dorsalis Pedis artery and Posterior Tibial artery pedal pulses are 2/4 bilateral with immedate capillary fill time. Pedal hair growth present. No varicosities and no lower extremity edema present bilateral. There is no pain with calf compression, swelling, warmth, erythema.   Neruologic: Grossly intact via light touch bilateral.  Negative  L-spine. Musculoskeletal: There is tenderness palpation on the plantar medial tubercle of the calcaneus at the insertion of plantar fascial as well as the arch of the foot.  Plantar fascial appears to be intact.  No pain to the Achilles tendon.  There is no pain with lateral compression of the calcaneus.  Muscular strength 5/5 in all groups tested bilateral.  Gait: Unassisted, Nonantalgic.       Assessment:   29 year old female with plantar fasciitis     Plan:  -Treatment options discussed including all alternatives, risks, and complications -Etiology of symptoms were discussed -X-rays were obtained and reviewed with the patient.  -Compliance exercise daily.  Discussion modifications and orthotics.  Anti-inflammatories as needed.  Consider steroid injection as well.. -If no improvement MRI  Vivi Barrack DPM

## 2019-11-07 ENCOUNTER — Other Ambulatory Visit: Payer: Self-pay | Admitting: Podiatry

## 2019-11-07 DIAGNOSIS — M722 Plantar fascial fibromatosis: Secondary | ICD-10-CM

## 2019-11-18 ENCOUNTER — Other Ambulatory Visit: Payer: Self-pay | Admitting: Nephrology

## 2019-11-18 DIAGNOSIS — N179 Acute kidney failure, unspecified: Secondary | ICD-10-CM

## 2019-11-24 ENCOUNTER — Ambulatory Visit
Admission: RE | Admit: 2019-11-24 | Discharge: 2019-11-24 | Disposition: A | Discharge status: Home / Self Care | Payer: Managed Care, Other (non HMO) | Source: Ambulatory Visit | Attending: Nephrology | Admitting: Nephrology

## 2019-11-24 ENCOUNTER — Other Ambulatory Visit: Payer: Self-pay

## 2019-11-24 DIAGNOSIS — N179 Acute kidney failure, unspecified: Secondary | ICD-10-CM

## 2019-12-11 ENCOUNTER — Ambulatory Visit: Payer: Managed Care, Other (non HMO) | Admitting: Podiatry

## 2020-04-30 ENCOUNTER — Ambulatory Visit
Admission: EM | Admit: 2020-04-30 | Discharge: 2020-04-30 | Disposition: A | Discharge status: Home / Self Care | Payer: Managed Care, Other (non HMO)

## 2020-04-30 ENCOUNTER — Other Ambulatory Visit: Payer: Self-pay

## 2020-04-30 DIAGNOSIS — Z1152 Encounter for screening for COVID-19: Secondary | ICD-10-CM

## 2020-04-30 DIAGNOSIS — R0981 Nasal congestion: Secondary | ICD-10-CM

## 2020-04-30 HISTORY — DX: Attention-deficit hyperactivity disorder, unspecified type: F90.9

## 2020-04-30 NOTE — Discharge Instructions (Addendum)
Recommend mucinex for your symptoms. They have severe sinus brand to help symptoms and mucous.  Covid test pending.  Hydrate, rest  Follow up as needed for continued or worsening symptoms

## 2020-04-30 NOTE — ED Triage Notes (Signed)
Patient presents to Urgent Care with complaints of congestion, SOB, diarrhea, left jaw pain that radiates to upper left chest area. Pt reports pain 4/10 and describes it as a dull pain. Pt states SOB, congestion started yesterday.    Denies fever, n/v, or abdominal pain.

## 2020-05-02 LAB — SARS-COV-2, NAA 2 DAY TAT

## 2020-05-02 LAB — NOVEL CORONAVIRUS, NAA: SARS-CoV-2, NAA: DETECTED — AB

## 2020-05-02 NOTE — ED Provider Notes (Signed)
[name]    CSN: [id number] Arrival date & time: 04/30/20  1110      History   Chief Complaint Chief Complaint  Patient presents with  . Nasal Congestion  . Shortness of Breath  . Neck Pain  . Chest Pain    HPI Jami Mickel is a 30 y.o. female.   30 year old female who presents today with complaints of cough, congestion, shortness of breath, diarrhea, left jaw pain, chest pain.  This started yesterday.  No fever, chills.  Symptoms are worsened with coughing and deep breathing.     Past Medical History:  Diagnosis Date  . ADHD   . First degree perineal laceration during delivery 04/23/2016  . Medical history non-contributory   . Postpartum care following vaginal delivery (1/13) 04/23/2016  . Postpartum care following vaginal delivery (1/14) 04/23/2016    Patient Active Problem List   Diagnosis Date Noted  . First degree perineal laceration 12/05/2018  . Encounter for planned induction of labor 12/04/2018  . SVD  10/31/2017  . Postpartum state 04/23/2016  . Postpartum care following vaginal delivery  (8/26) 04/23/2016    Past Surgical History:  Procedure Laterality Date  . BREAST SURGERY     augmentation  . CYSTECTOMY     from throat  . WISDOM TOOTH EXTRACTION      OB History    Gravida  4   Para  3   Term  3   Preterm      AB  1   Living  3     SAB      IAB  1   Ectopic      Multiple  0   Live Births  3            Home Medications    Prior to Admission medications   Medication Sig Start Date End Date Taking? Authorizing Provider  amphetamine-dextroamphetamine (ADDERALL XR) 5 MG 24 hr capsule Take 5 mg by mouth every morning. 04/22/20   [provider]  ibuprofen (ADVIL) 600 MG tablet Take 1 tablet (600 mg total) by mouth every 6 (six) hours. 12/05/18   Sigmon, Scarlette Slice, CNM  sertraline (ZOLOFT) 50 MG tablet Take 1 tablet (50 mg total) by mouth daily. 12/05/18 12/05/19  Karena Addison, CNM    Family  History Family History  Problem Relation Age of Onset  . Healthy Mother   . Healthy Father     Social History Social History   Tobacco Use  . Smoking status: Never Smoker  . Smokeless tobacco: Never Used  Vaping Use  . Vaping Use: Never used  Substance Use Topics  . Alcohol use: No  . Drug use: No     Allergies   Patient has no known allergies.   Review of Systems Review of Systems   Physical Exam Triage Vital Signs ED Triage Vitals  Enc Vitals Group     BP 04/30/20 1153 112/77     Pulse Rate 04/30/20 1153 74     Resp 04/30/20 1153 18     Temp 04/30/20 1153 97.7 F (36.5 C)     Temp Source 04/30/20 1153 Tympanic     SpO2 04/30/20 1153 98 %     Weight 04/30/20 1201 208 lb (94.3 kg)     Height --      Head Circumference --      Peak Flow --      Pain Score 04/30/20 1201 4     Pain  Loc --      Pain Edu? --      Excl. in GC? --    No data found.  Updated Vital Signs BP 112/77 (BP Location: Left Arm)   Pulse 74   Temp 97.7 F (36.5 C) (Tympanic)   Resp 18   Wt 208 lb (94.3 kg)   LMP  (Within Weeks)   SpO2 98%   BMI 33.57 kg/m   Visual Acuity Right Eye Distance:   Left Eye Distance:   Bilateral Distance:    Right Eye Near:   Left Eye Near:    Bilateral Near:     Physical Exam Vitals and nursing note reviewed.  Constitutional:      General: She is not in acute distress.    Appearance: Normal appearance. She is not ill-appearing, toxic-appearing or diaphoretic.  HENT:     Head: Normocephalic.     Right Ear: Tympanic membrane and ear canal normal.     Left Ear: Tympanic membrane and ear canal normal.     Nose: Nose normal.     Mouth/Throat:     Pharynx: Oropharynx is clear.  Eyes:     Conjunctiva/sclera: Conjunctivae normal.  Pulmonary:     Effort: Pulmonary effort is normal.     Breath sounds: Normal breath sounds.  Musculoskeletal:        General: Normal range of motion.     Cervical back: Normal range of motion.     Comments:  Palpable chest wall pain on left.   Skin:    General: Skin is warm and dry.     Findings: No rash.  Neurological:     Mental Status: She is alert.  Psychiatric:        Mood and Affect: Mood normal.      UC Treatments / Results  Labs (all labs ordered are listed, but only abnormal results are displayed) Labs Reviewed  NOVEL CORONAVIRUS, NAA - Abnormal; Notable for the following components:      Result Value   SARS-CoV-2, NAA Detected (*)    All other components within normal limits   Narrative:    Performed at:  [address], [place], [place]  [id number] Lab Director: Jolene Schimke MD, Phone:  [phone]  SARS-COV-2, NAA 2 DAY TAT   Narrative:    Performed at:  [address] [place] [address], [place], [place]  [id number] Lab Director: Jolene Schimke MD, Phone:  [phone]    EKG   Radiology No results found.  Procedures Procedures (including critical care time)  Medications Ordered in UC Medications - No data to display  Initial Impression / Assessment and Plan / UC Course  I have reviewed the triage vital signs and the nursing notes.  Pertinent labs & imaging results that were available during my care of the patient were reviewed by me and considered in my medical decision making (see chart for details).     Has congestion, viral illness Patient having cough and chest wall pain that is palpable on exam. Lungs clear.  No specific concerns on exam.  No need for EKG to rule out ACS at this time. Recommend Mucinex for congestion, cough COVID test pending. Rest, hydrate Follow up as needed for continued or worsening symptoms  Final Clinical Impressions(s) / UC Diagnoses   Final diagnoses:  Sinus congestion     Discharge Instructions     Recommend mucinex for your symptoms. They have severe sinus brand to help symptoms and  mucous.  Covid test pending.  Hydrate, rest  Follow up as needed for continued or worsening  symptoms     ED Prescriptions    None     PDMP not reviewed this encounter.   Janace Aris, NP 05/02/20 1549

## 2021-01-29 IMAGING — US US RENAL
1 series · 14 of 25 positions shown · non-contrast
Comparison: None.

CLINICAL DATA: Acute kidney injury

EXAM:
RENAL / URINARY TRACT ULTRASOUND COMPLETE

[Series 1: us renal · 0.25mm/px · 14 of 40 slices shown]
[im 1/40]
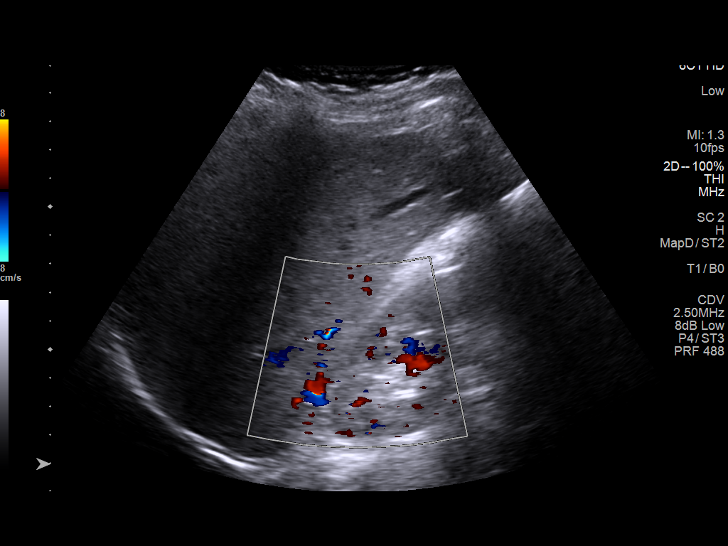
[im 4/40]
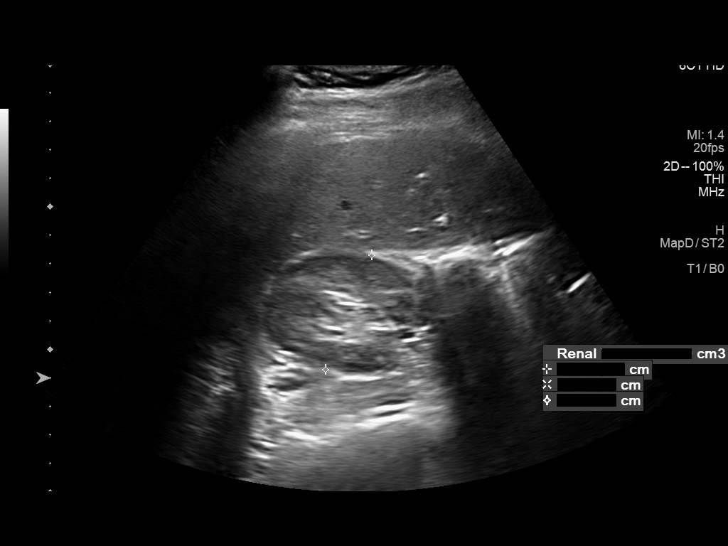
[im 7/40]
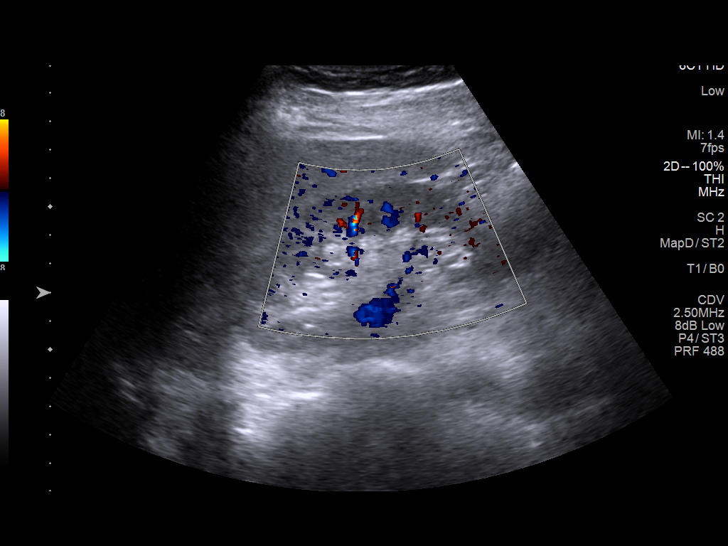
[im 10/40]
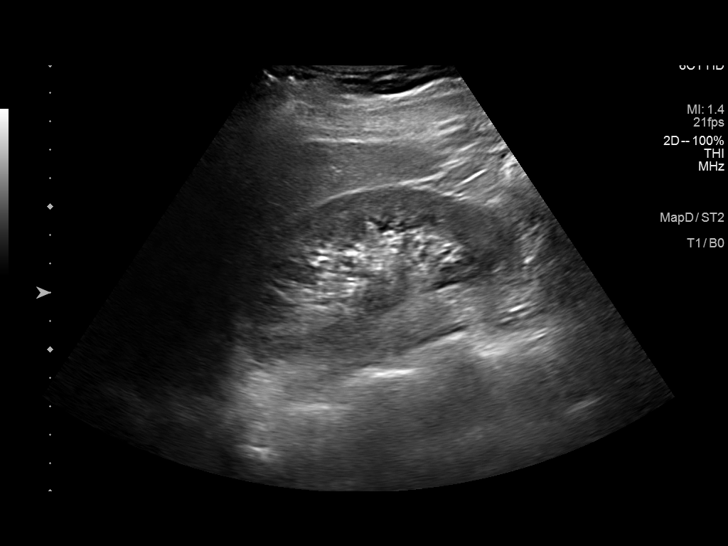
[im 14/40]
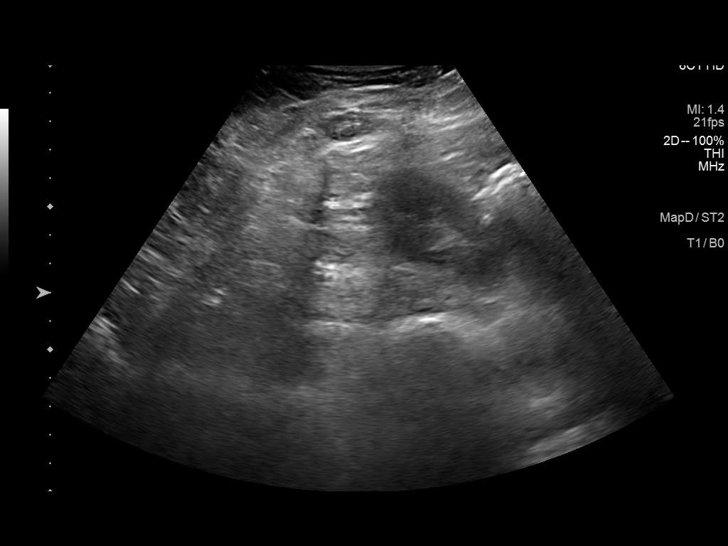
[im 15/40]
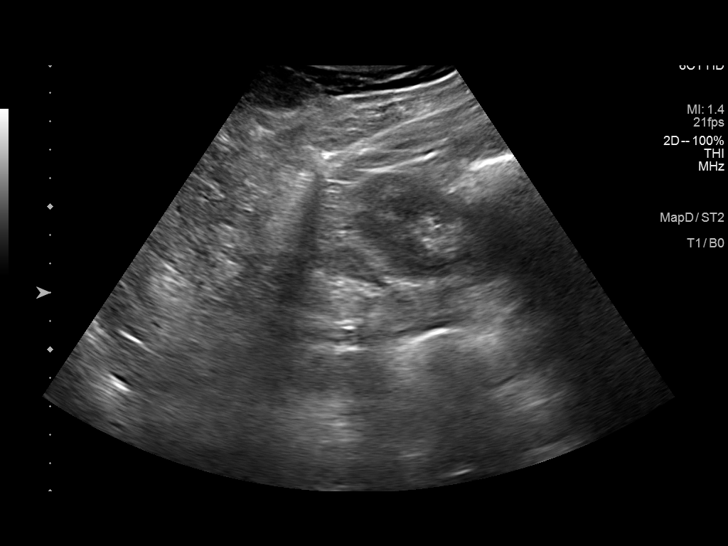
[im 18/40]
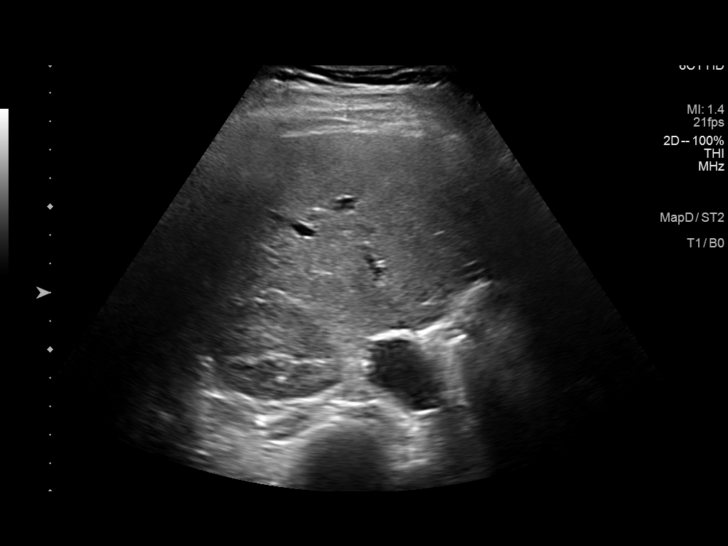
[im 22/40]
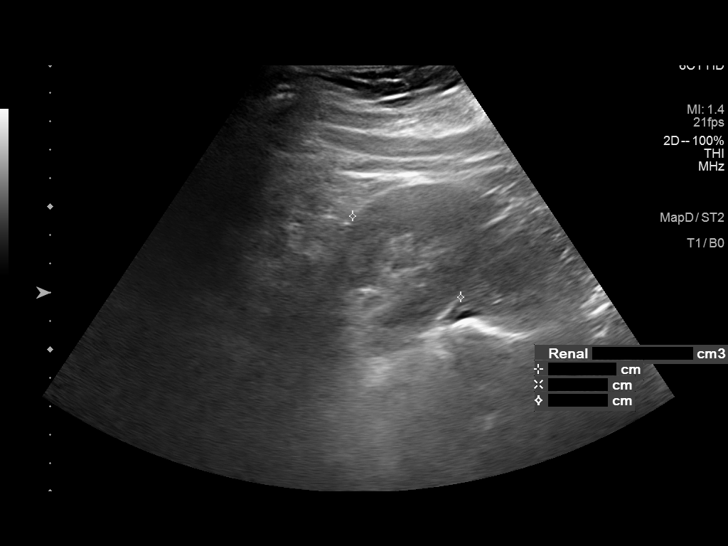
[im 25/40]
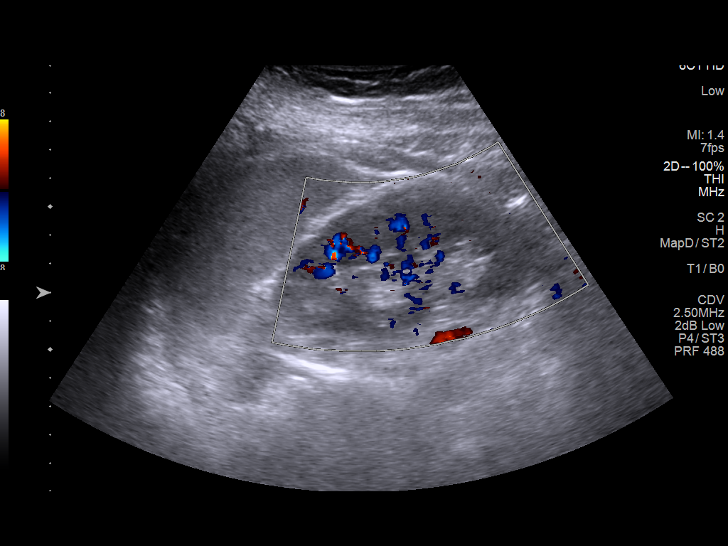
[im 27/40]
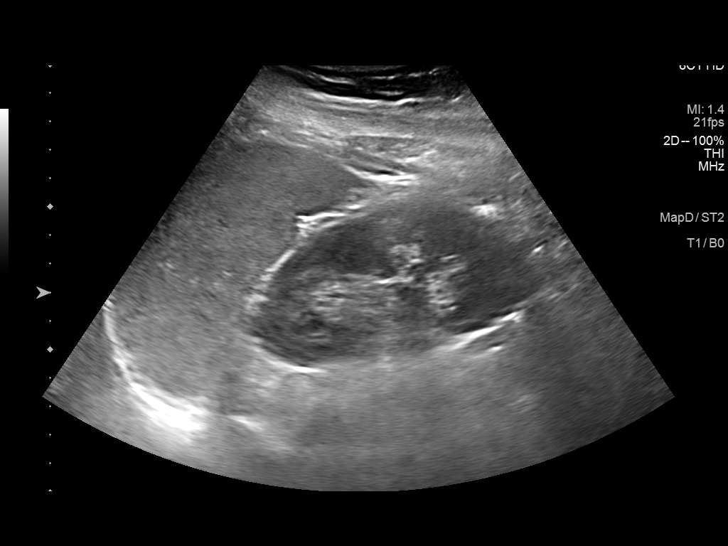
[im 30/40]
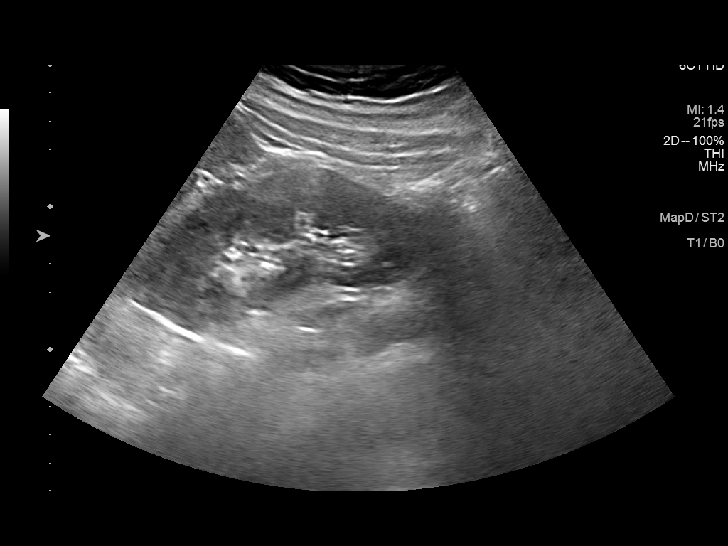
[im 33/40]
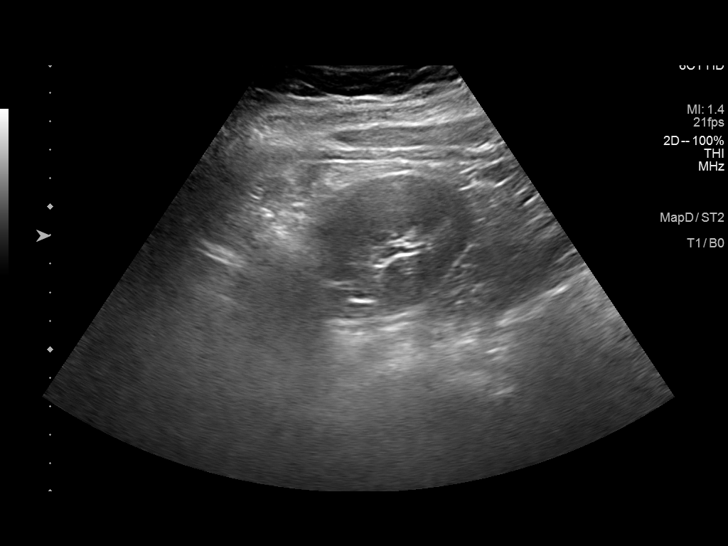
[im 36/40]
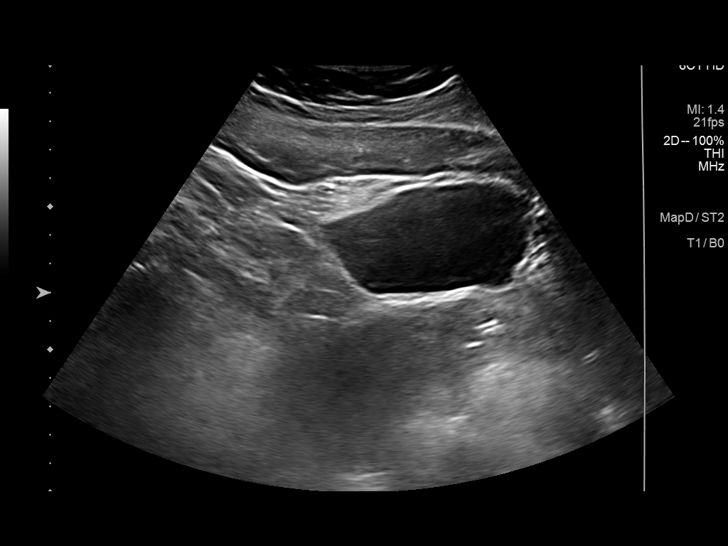
[im 40/40]
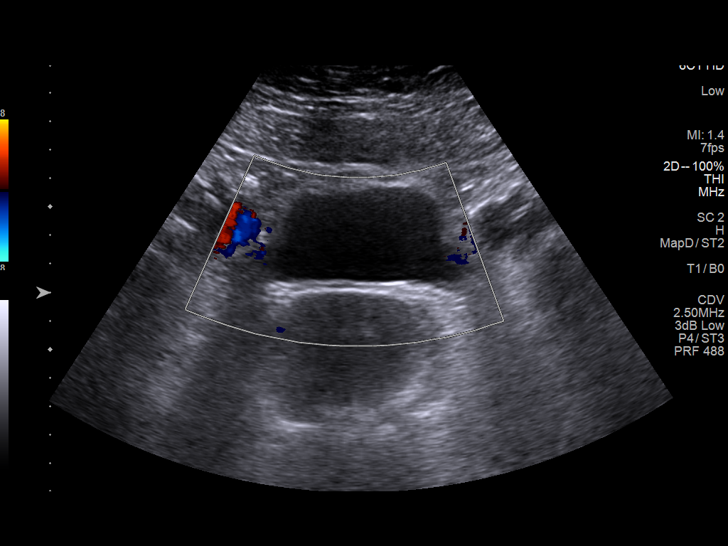

[14 of 25 positions shown; findings below may reference images not displayed]

FINDINGS: Right Kidney:

Renal measurements: 10.9 x 3.9 x 4.4 cm = volume: 96 mL .
Echogenicity within normal limits. No mass or hydronephrosis
visualized.

Left Kidney:

Renal measurements: 10.9 x 4.5 x 4.7 cm = volume: 120 mL.
Echogenicity within normal limits. No mass or hydronephrosis
visualized.

Bladder:

Appears normal for degree of bladder distention.

Other:

None.
IMPRESSION: Unremarkable ultrasound of the kidneys and bladder without evidence
of obstructive uropathy.

## 2022-05-15 ENCOUNTER — Other Ambulatory Visit (HOSPITAL_COMMUNITY): Payer: Self-pay

## 2022-05-16 ENCOUNTER — Other Ambulatory Visit (HOSPITAL_COMMUNITY): Payer: Self-pay

## 2022-05-16 MED ORDER — LISDEXAMFETAMINE DIMESYLATE 30 MG PO CAPS
30.0000 mg | ORAL_CAPSULE | Freq: Every day | ORAL | 0 refills | Status: DC
  Filled 2022-05-16: qty 30, 30d supply, fill #0

## 2022-06-05 ENCOUNTER — Other Ambulatory Visit (HOSPITAL_COMMUNITY): Payer: Self-pay

## 2022-06-05 MED ORDER — BUPROPION HCL ER (SR) 150 MG PO TB12
150.0000 mg | ORAL_TABLET | Freq: Two times a day (BID) | ORAL | 2 refills | Status: DC
  Filled 2022-06-05 – 2022-06-12 (×2): qty 60, 30d supply, fill #0

## 2022-06-05 MED ORDER — LISDEXAMFETAMINE DIMESYLATE 40 MG PO CAPS
40.0000 mg | ORAL_CAPSULE | Freq: Every day | ORAL | 0 refills | Status: DC
  Filled 2022-06-05 – 2022-07-03 (×2): qty 30, 30d supply, fill #0

## 2022-06-07 ENCOUNTER — Other Ambulatory Visit (HOSPITAL_COMMUNITY): Payer: Self-pay

## 2022-06-12 ENCOUNTER — Other Ambulatory Visit (HOSPITAL_COMMUNITY): Payer: Self-pay

## 2022-06-12 MED ORDER — LISDEXAMFETAMINE DIMESYLATE 20 MG PO CAPS
ORAL_CAPSULE | ORAL | 0 refills | Status: DC
  Filled 2022-06-12: qty 60, 30d supply, fill #0

## 2022-06-13 ENCOUNTER — Other Ambulatory Visit (HOSPITAL_COMMUNITY): Payer: Self-pay

## 2022-06-14 ENCOUNTER — Other Ambulatory Visit (HOSPITAL_COMMUNITY): Payer: Self-pay

## 2022-06-30 ENCOUNTER — Other Ambulatory Visit (HOSPITAL_COMMUNITY): Payer: Self-pay

## 2022-07-03 ENCOUNTER — Other Ambulatory Visit (HOSPITAL_COMMUNITY): Payer: Self-pay

## 2022-07-11 ENCOUNTER — Other Ambulatory Visit: Payer: Self-pay

## 2022-09-01 ENCOUNTER — Other Ambulatory Visit (HOSPITAL_COMMUNITY): Payer: Self-pay

## 2022-09-01 MED ORDER — LISDEXAMFETAMINE DIMESYLATE 40 MG PO CAPS
40.0000 mg | ORAL_CAPSULE | Freq: Every day | ORAL | 0 refills | Status: DC
  Filled 2022-09-01 (×2): qty 30, 30d supply, fill #0

## 2022-09-01 MED ORDER — BUPROPION HCL ER (SR) 150 MG PO TB12
150.0000 mg | ORAL_TABLET | Freq: Two times a day (BID) | ORAL | 2 refills | Status: DC
  Filled 2022-09-01: qty 60, 30d supply, fill #0
  Filled 2022-10-17: qty 60, 30d supply, fill #1

## 2022-09-07 ENCOUNTER — Other Ambulatory Visit (HOSPITAL_COMMUNITY): Payer: Self-pay

## 2022-09-11 ENCOUNTER — Other Ambulatory Visit (HOSPITAL_COMMUNITY): Payer: Self-pay

## 2022-10-16 ENCOUNTER — Other Ambulatory Visit (HOSPITAL_COMMUNITY): Payer: Self-pay

## 2022-10-16 MED ORDER — JORNAY PM 20 MG PO CP24
20.0000 mg | ORAL_CAPSULE | Freq: Every evening | ORAL | 0 refills | Status: AC
  Filled 2022-10-16 – 2022-10-18 (×2): qty 30, 30d supply, fill #0

## 2022-10-17 ENCOUNTER — Other Ambulatory Visit (HOSPITAL_COMMUNITY): Payer: Self-pay

## 2022-10-18 ENCOUNTER — Other Ambulatory Visit (HOSPITAL_COMMUNITY): Payer: Self-pay

## 2022-10-24 ENCOUNTER — Other Ambulatory Visit (HOSPITAL_COMMUNITY): Payer: Self-pay

## 2022-10-26 ENCOUNTER — Other Ambulatory Visit (HOSPITAL_COMMUNITY): Payer: Self-pay

## 2023-02-12 ENCOUNTER — Ambulatory Visit
Admission: RE | Admit: 2023-02-12 | Discharge: 2023-02-12 | Disposition: A | Discharge status: Home / Self Care | Payer: 59 | Source: Ambulatory Visit | Attending: Emergency Medicine | Admitting: Emergency Medicine

## 2023-02-12 VITALS — BP 105/70 | HR 160 | Temp 102.4°F | Resp 20

## 2023-02-12 DIAGNOSIS — J069 Acute upper respiratory infection, unspecified: Secondary | ICD-10-CM

## 2023-02-12 LAB — POC COVID19/FLU A&B COMBO
Covid Antigen, POC: NEGATIVE
Influenza A Antigen, POC: NEGATIVE
Influenza B Antigen, POC: NEGATIVE

## 2023-02-12 MED ORDER — AZITHROMYCIN 250 MG PO TABS: 250.0000 mg | ORAL_TABLET | Freq: Every day | ORAL | 0 refills | Status: DC

## 2023-02-12 MED ORDER — ACETAMINOPHEN 500 MG PO TABS
1000.0000 mg | ORAL_TABLET | Freq: Once | ORAL | Status: AC
  Administered 2023-02-12: 1000 mg via ORAL

## 2023-02-12 NOTE — ED Provider Notes (Signed)
Rachel Oliver    CSN: [id number] Arrival date & time: 02/12/23  1544      History   Chief Complaint Chief Complaint  Patient presents with   Chills    Fever, chills, cough - Entered by patient    HPI Rachel Oliver is a 32 y.o. female.   Patient presents for evaluation of general malaise and fatigue, fever, chills and a nonproductive cough present for 2 days.  Known sick contacts in household, child recently diagnosed with pneumonia.  Has been managing symptoms with Tylenol, last dose at around 7 AM.  Denies ear pain, sore throat, congestion.  Respiratory history, non-smoker. Past Medical History:  Diagnosis Date   ADHD    First degree perineal laceration during delivery 04/23/2016   Medical history non-contributory    Postpartum care following vaginal delivery (1/13) 04/23/2016   Postpartum care following vaginal delivery (1/14) 04/23/2016    Patient Active Problem List   Diagnosis Date Noted   First degree perineal laceration 12/05/2018   Encounter for planned induction of labor 12/04/2018   SVD  10/31/2017   Postpartum state 04/23/2016   Postpartum care following vaginal delivery  (8/26) 04/23/2016    Past Surgical History:  Procedure Laterality Date   BREAST SURGERY     augmentation   CYSTECTOMY     from throat   WISDOM TOOTH EXTRACTION      OB History     Gravida  4   Para  3   Term  3   Preterm      AB  1   Living  3      SAB      IAB  1   Ectopic      Multiple  0   Live Births  3            Home Medications    Prior to Admission medications   Medication Sig Start Date End Date Taking? Authorizing Provider  amphetamine-dextroamphetamine (ADDERALL XR) 5 MG 24 hr capsule Take 5 mg by mouth every morning. 04/22/20   [provider]  azithromycin (ZITHROMAX) 250 MG tablet Take 1 tablet (250 mg total) by mouth daily. Take first 2 tablets together, then 1 every day until finished. 02/12/23   Lillyanna Glandon, Elita Boone, NP   buPROPion (WELLBUTRIN SR) 150 MG 12 hr tablet Take 1 tablet (150 mg total) by mouth 2 (two) times daily. 06/05/22     buPROPion (WELLBUTRIN SR) 150 MG 12 hr tablet Take 1 tablet (150 mg total) by mouth 2 (two) times daily. 09/01/22     ibuprofen (ADVIL) 600 MG tablet Take 1 tablet (600 mg total) by mouth every 6 (six) hours. 12/05/18   Karena Addison, CNM  lisdexamfetamine (VYVANSE) 20 MG capsule Take 2 capsules by mouth daily 06/12/22     lisdexamfetamine (VYVANSE) 30 MG capsule Take 1 capsule (30 mg total) by mouth daily. 05/15/22     lisdexamfetamine (VYVANSE) 40 MG capsule Take 1 capsule (40 mg total) by mouth daily. 09/01/22     Methylphenidate HCl ER, PM, (JORNAY PM) 20 MG CP24 Take 1 capsule (20 mg total) by mouth every evening between 6:30PM and 9:30PM 10/16/22     sertraline (ZOLOFT) 50 MG tablet Take 1 tablet (50 mg total) by mouth daily. 12/05/18 12/05/19  Karena Addison, CNM    Family History Family History  Problem Relation Age of Onset   Healthy Mother    Healthy Father     Social History  Social History   Tobacco Use   Smoking status: Never   Smokeless tobacco: Never  Vaping Use   Vaping status: Never Used  Substance Use Topics   Alcohol use: No   Drug use: No     Allergies   Patient has no known allergies.   Review of Systems Review of Systems   Physical Exam Triage Vital Signs ED Triage Vitals [02/12/23 1655]  Encounter Vitals Group     BP 105/70     Systolic BP Percentile      Diastolic BP Percentile      Pulse Rate (!) 160     Resp 20     Temp (!) 105.1 F (40.6 C)     Temp Source Oral     SpO2 98 %     Weight      Height      Head Circumference      Peak Flow      Pain Score      Pain Loc      Pain Education      Exclude from Growth Chart    No data found.  Updated Vital Signs BP 105/70   Pulse (!) 160 Comment: by provider count  Temp (!) 102.4 F (39.1 C) (Temporal)   Resp 20   SpO2 98%   Visual Acuity Right Eye Distance:    Left Eye Distance:   Bilateral Distance:    Right Eye Near:   Left Eye Near:    Bilateral Near:     Physical Exam Constitutional:      Appearance: Normal appearance.  HENT:     Head: Normocephalic.     Right Ear: Tympanic membrane, ear canal and external ear normal.     Left Ear: Tympanic membrane, ear canal and external ear normal.     Nose: Congestion present. No rhinorrhea.     Mouth/Throat:     Pharynx: No oropharyngeal exudate or posterior oropharyngeal erythema.  Cardiovascular:     Rate and Rhythm: Normal rate and regular rhythm.     Pulses: Normal pulses.     Heart sounds: Normal heart sounds.  Pulmonary:     Effort: Pulmonary effort is normal.     Breath sounds: Normal breath sounds.  Musculoskeletal:     Cervical back: Normal range of motion and neck supple.  Neurological:     Mental Status: She is alert and oriented to person, place, and time. Mental status is at baseline.      UC Treatments / Results  Labs (all labs ordered are listed, but only abnormal results are displayed) Labs Reviewed  POC COVID19/FLU A&B COMBO    EKG   Radiology No results found.  Procedures Procedures (including critical care time)  Medications Ordered in UC Medications  acetaminophen (TYLENOL) tablet 1,000 mg (1,000 mg Oral Given 02/12/23 1754)    Initial Impression / Assessment and Plan / UC Course  I have reviewed the triage vital signs and the nursing notes.  Pertinent labs & imaging results that were available during my care of the patient were reviewed by me and considered in my medical decision making (see chart for details).  Acute upper respiratory infection  Fever and tachycardia noted in triage,  apical pulse manually checked resulting at 160, temperature getting as high as 104 with oral check, Tylenol 1000 mg given in office, COVID and flu rapid testing negative, discussed findings with patient, etiology most likely viral however as patient fevers elevated  with tachycardia  will provide bacterial coverage, azithromycin sent to pharmacy, recommended supportive measures and may use additional over-the-counter medicines as needed for cough, advised follow-up if symptoms continue to persist or worsen despite antibiotic use Final Clinical Impressions(s) / UC Diagnoses   Final diagnoses:  Acute URI     Discharge Instructions      Fever and heart rate noted in triage are very elevated, please take Tylenol consistently to keep fever managed, may take 500 to 1000 mg every 6 hours  COVID, flu A &B negative  Your symptoms today are most likely being caused by a virus and should steadily improve in time it can take up to 7 to 10 days before you truly start to see a turnaround however things will get better  You are being prophylactically started on antibiotic due to the extent of your heart rate and fever which noted on one of the machines was as high as 104, do not want you to become septic  Even if there is no bacteria present antibiotic will keep symptoms from progressing to pneumonia or more serious viral illness, take azithromycin as directed    You can take Tylenol and/or Ibuprofen as needed for fever reduction and pain relief.   For cough: honey 1/2 to 1 teaspoon (you can dilute the honey in water or another fluid).  You can also use guaifenesin and dextromethorphan for cough. You can use a humidifier for chest congestion and cough.  If you don't have a humidifier, you can sit in the bathroom with the hot shower running.      For sore throat: try warm salt water gargles, cepacol lozenges, throat spray, warm tea or water with lemon/honey, popsicles or ice, or OTC cold relief medicine for throat discomfort.   For congestion: take a daily anti-histamine like Zyrtec, Claritin, and a oral decongestant, such as pseudoephedrine.  You can also use Flonase 1-2 sprays in each nostril daily.   It is important to stay hydrated: drink plenty of fluids  (water, gatorade/powerade/pedialyte, juices, or teas) to keep your throat moisturized and help further relieve irritation/discomfort.    ED Prescriptions     Medication Sig Dispense Auth. Provider   azithromycin (ZITHROMAX) 250 MG tablet  (Status: Discontinued) Take 1 tablet (250 mg total) by mouth daily. Take first 2 tablets together, then 1 every day until finished. 6 tablet Amilee Janvier R, NP   azithromycin (ZITHROMAX) 250 MG tablet  (Status: Discontinued) Take 1 tablet (250 mg total) by mouth daily. Take first 2 tablets together, then 1 every day until finished. 6 tablet Adelaida Reindel R, NP   azithromycin (ZITHROMAX) 250 MG tablet Take 1 tablet (250 mg total) by mouth daily. Take first 2 tablets together, then 1 every day until finished. 6 tablet Valinda Hoar, NP      PDMP not reviewed this encounter.   Valinda Hoar, NP 02/12/23 1825

## 2023-02-12 NOTE — Discharge Instructions (Addendum)
Fever and heart rate noted in triage are very elevated, please take Tylenol consistently to keep fever managed, may take 500 to 1000 mg every 6 hours  COVID, flu A &B negative  Your symptoms today are most likely being caused by a virus and should steadily improve in time it can take up to 7 to 10 days before you truly start to see a turnaround however things will get better  You are being prophylactically started on antibiotic due to the extent of your heart rate and fever which noted on one of the machines was as high as 104, do not want you to become septic  Even if there is no bacteria present antibiotic will keep symptoms from progressing to pneumonia or more serious viral illness, take azithromycin as directed    You can take Tylenol and/or Ibuprofen as needed for fever reduction and pain relief.   For cough: honey 1/2 to 1 teaspoon (you can dilute the honey in water or another fluid).  You can also use guaifenesin and dextromethorphan for cough. You can use a humidifier for chest congestion and cough.  If you don't have a humidifier, you can sit in the bathroom with the hot shower running.      For sore throat: try warm salt water gargles, cepacol lozenges, throat spray, warm tea or water with lemon/honey, popsicles or ice, or OTC cold relief medicine for throat discomfort.   For congestion: take a daily anti-histamine like Zyrtec, Claritin, and a oral decongestant, such as pseudoephedrine.  You can also use Flonase 1-2 sprays in each nostril daily.   It is important to stay hydrated: drink plenty of fluids (water, gatorade/powerade/pedialyte, juices, or teas) to keep your throat moisturized and help further relieve irritation/discomfort.

## 2023-02-22 ENCOUNTER — Encounter (HOSPITAL_BASED_OUTPATIENT_CLINIC_OR_DEPARTMENT_OTHER): Payer: Self-pay | Admitting: Emergency Medicine

## 2023-02-22 ENCOUNTER — Other Ambulatory Visit: Payer: Self-pay

## 2023-02-22 ENCOUNTER — Emergency Department (HOSPITAL_BASED_OUTPATIENT_CLINIC_OR_DEPARTMENT_OTHER)
Admission: EM | Admit: 2023-02-22 | Discharge: 2023-02-22 | Disposition: A | Discharge status: Home / Self Care | Payer: 59 | Attending: Emergency Medicine | Admitting: Emergency Medicine

## 2023-02-22 ENCOUNTER — Emergency Department (HOSPITAL_BASED_OUTPATIENT_CLINIC_OR_DEPARTMENT_OTHER): Payer: 59 | Admitting: Radiology

## 2023-02-22 DIAGNOSIS — R0602 Shortness of breath: Secondary | ICD-10-CM | POA: Diagnosis present

## 2023-02-22 MED ORDER — ALBUTEROL SULFATE HFA 108 (90 BASE) MCG/ACT IN AERS: 2.0000 | INHALATION_SPRAY | RESPIRATORY_TRACT | Status: DC | PRN

## 2023-02-22 MED ORDER — DEXAMETHASONE 4 MG PO TABS
10.0000 mg | ORAL_TABLET | Freq: Once | ORAL | Status: AC
  Administered 2023-02-22: 10 mg via ORAL
  Filled 2023-02-22: qty 3

## 2023-02-22 NOTE — ED Triage Notes (Signed)
Sob, tachy hr 110-160 at home, fever Dx PNA. Today worse. Difficulty speaking in full sentences

## 2023-02-22 NOTE — Discharge Instructions (Addendum)
I think most likely you are still struggling from your initial illness.  Please follow-up with your doctor as scheduled tomorrow.  Please return to the emergency department for worsening of your symptoms, return if you start coughing up blood if you feel you get a pass out.  Take tylenol 2 pills 4 times a day and motrin 4 pills 3 times a day.  Drink plenty of fluids.  Return for worsening shortness of breath, headache, confusion. Follow up with your family doctor.

## 2023-02-22 NOTE — ED Provider Notes (Addendum)
EMERGENCY DEPARTMENT AT [facility] Provider Note   CSN: [id number] Arrival date & time: 02/22/23  1716     History  Chief Complaint  Patient presents with   Shortness of Breath    Rachel Oliver is a 32 y.o. female.  32 yo F with a cc of sob.  The patient has been sick with an upper respiratory illness.  All 3 of her children have been sick this past week and have gotten better.  She was diagnosed with atypical pneumonia and she was started on azithromycin and prednisone.  She was also given an albuterol inhaler.  Her symptoms got transiently better on steroids and then after she stopped she started feeling a bit worse.  Had worsening cough and felt like her breathing had gotten worse.  She has an appointment with her family doctor tomorrow but was concerned and went to urgent care.  There they were concerned that perhaps she had a pulmonary embolism and need to come to the ED for evaluation.  She does have some difficulty breathing at rest but feels like it is much worse when she is coughing.  Still having some fevers off and on.   Shortness of Breath      Home Medications Prior to Admission medications   Medication Sig Start Date End Date Taking? Authorizing Provider  amphetamine-dextroamphetamine (ADDERALL XR) 5 MG 24 hr capsule Take 5 mg by mouth every morning. 04/22/20   [provider]  azithromycin (ZITHROMAX) 250 MG tablet Take 1 tablet (250 mg total) by mouth daily. Take first 2 tablets together, then 1 every day until finished. 02/12/23   White, Elita Boone, NP  buPROPion (WELLBUTRIN SR) 150 MG 12 hr tablet Take 1 tablet (150 mg total) by mouth 2 (two) times daily. 06/05/22     buPROPion (WELLBUTRIN SR) 150 MG 12 hr tablet Take 1 tablet (150 mg total) by mouth 2 (two) times daily. 09/01/22     ibuprofen (ADVIL) 600 MG tablet Take 1 tablet (600 mg total) by mouth every 6 (six) hours. 12/05/18   Karena Addison, CNM  lisdexamfetamine (VYVANSE) 20 MG  capsule Take 2 capsules by mouth daily 06/12/22     lisdexamfetamine (VYVANSE) 30 MG capsule Take 1 capsule (30 mg total) by mouth daily. 05/15/22     lisdexamfetamine (VYVANSE) 40 MG capsule Take 1 capsule (40 mg total) by mouth daily. 09/01/22     Methylphenidate HCl ER, PM, (JORNAY PM) 20 MG CP24 Take 1 capsule (20 mg total) by mouth every evening between 6:30PM and 9:30PM 10/16/22     sertraline (ZOLOFT) 50 MG tablet Take 1 tablet (50 mg total) by mouth daily. 12/05/18 12/05/19  Karena Addison, CNM      Allergies    Patient has no known allergies.    Review of Systems   Review of Systems  Respiratory:  Positive for shortness of breath.     Physical Exam Updated Vital Signs BP 116/81 (BP Location: Right Arm)   Pulse (!) 104   Temp 98.8 F (37.1 C)   Resp 20   LMP 02/02/2023 (Approximate)   SpO2 100%  Physical Exam Vitals and nursing note reviewed.  Constitutional:      General: She is not in acute distress.    Appearance: She is well-developed. She is not diaphoretic.  HENT:     Head: Normocephalic and atraumatic.  Eyes:     Pupils: Pupils are equal, round, and reactive to light.  Cardiovascular:  Rate and Rhythm: Normal rate and regular rhythm.     Heart sounds: No murmur heard.    No friction rub. No gallop.  Pulmonary:     Effort: Pulmonary effort is normal.     Breath sounds: No wheezing or rales.  Abdominal:     General: There is no distension.     Palpations: Abdomen is soft.     Tenderness: There is no abdominal tenderness.  Musculoskeletal:        General: No tenderness.     Cervical back: Normal range of motion and neck supple.  Skin:    General: Skin is warm and dry.  Neurological:     Mental Status: She is alert and oriented to person, place, and time.  Psychiatric:        Behavior: Behavior normal.     ED Results / Procedures / Treatments   Labs (all labs ordered are listed, but only abnormal results are displayed) Labs Reviewed - No data to  display  EKG EKG Interpretation Date/Time:  Thursday February 22 2023 17:23:40 EST Ventricular Rate:  112 PR Interval:  134 QRS Duration:  74 QT Interval:  322 QTC Calculation: 439 R Axis:   84  Text Interpretation: Sinus tachycardia Otherwise normal ECG No old tracing to compare Confirmed by Melene Plan [id number]) on 02/22/2023 5:34:40 PM  Radiology No results found.  Procedures Procedures    Medications Ordered in ED Medications  albuterol (VENTOLIN HFA) 108 (90 Base) MCG/ACT inhaler 2 puff (has no administration in time range)  dexamethasone (DECADRON) tablet 10 mg (has no administration in time range)    ED Course/ Medical Decision Making/ A&P                                 Medical Decision Making Amount and/or Complexity of Data Reviewed Radiology: ordered.  Risk Prescription drug management.   32 yo F with a chief complaint of difficulty breathing.  Patient is well-appearing nontoxic.  She is not tachypneic.  She is clear lung sounds for me.  She is mildly tachycardic.  She is able to talk in complete sentences.  After some discussion with the patient about her symptoms her heart rate did come down in the room to less than 100.  I discussed different options with her.  Shared decision making I did discuss the option of obtaining labs and CT imaging to assess for pulmonary embolism as that was the concern in urgent care.  At this point she elects to go home.  She has follow-up with her primary care provider tomorrow.  I encouraged her to return anytime that she felt she was worsening or if she changed her mind.  I will give a singular dose of steroids here.  I think based on history most likely the patient had a bit of rebound symptoms after she stopped her steroid.  I think it is unlikely she has a pulmonary embolism as it sounds much more likely to be viral syndrome.  She had fevers documented as high as 105 at the onset.  Has 3 sick contacts at home.  Has no obvious cause  for pulmonary embolism.  I did offer a different antibiotic which she is currently declining.    6:05 PM:  I have discussed the diagnosis/risks/treatment options with the patient.  Evaluation and diagnostic testing in the emergency department does not suggest an emergent condition requiring admission or immediate intervention  beyond what has been performed at this time.  They will follow up with PCP. We also discussed returning to the ED immediately if new or worsening sx occur. We discussed the sx which are most concerning (e.g., sudden worsening pain, fever, inability to tolerate by mouth) that necessitate immediate return. Medications administered to the patient during their visit and any new prescriptions provided to the patient are listed below.  Medications given during this visit Medications  albuterol (VENTOLIN HFA) 108 (90 Base) MCG/ACT inhaler 2 puff (has no administration in time range)  dexamethasone (DECADRON) tablet 10 mg (has no administration in time range)     The patient appears reasonably screen and/or stabilized for discharge and I doubt any other medical condition or other [facility] requiring further screening, evaluation, or treatment in the ED at this time prior to discharge.          Final Clinical Impression(s) / ED Diagnoses Final diagnoses:  SOB (shortness of breath)    Rx / DC Orders ED Discharge Orders     None            Melene Plan, DO 02/22/23 2010

## 2023-06-15 ENCOUNTER — Ambulatory Visit: Payer: 59 | Admitting: Nurse Practitioner

## 2023-06-28 ENCOUNTER — Other Ambulatory Visit: Payer: Self-pay

## 2023-06-28 ENCOUNTER — Ambulatory Visit
Admission: RE | Admit: 2023-06-28 | Discharge: 2023-06-28 | Disposition: A | Discharge status: Home / Self Care | Source: Ambulatory Visit | Attending: Emergency Medicine | Admitting: Emergency Medicine

## 2023-06-28 VITALS — BP 112/82 | HR 88 | Temp 99.0°F | Resp 16

## 2023-06-28 DIAGNOSIS — J020 Streptococcal pharyngitis: Secondary | ICD-10-CM

## 2023-06-28 LAB — POCT RAPID STREP A (OFFICE): Rapid Strep A Screen: POSITIVE — AB

## 2023-06-28 MED ORDER — AMOXICILLIN 500 MG PO CAPS: 500.0000 mg | ORAL_CAPSULE | Freq: Two times a day (BID) | ORAL | 0 refills | Status: AC

## 2023-06-28 NOTE — ED Triage Notes (Signed)
 Patient presents to [facility] for evaluation of 2 days of sore throat, headache, back pain, stuffy nose and very mild cough.  Would like to be tested for strep.

## 2023-06-28 NOTE — Discharge Instructions (Signed)
Your rapid strep test today was positive  Take amoxicillin twice a day for the next 10 days, daily will see improvement in about 48 hours and steady progression from there  may use of salt gargles throat lozenges, warm liquids, teaspoons of honey and over-the-counter clippers septic spray for comfort  May give Tylenol or Motrin every 6 hours as needed for additional comfort  You may follow-up at urgent care as needed

## 2023-06-28 NOTE — ED Provider Notes (Signed)
 Rachel Oliver    CSN: [id number] Arrival date & time: 06/28/23  1517      History   Chief Complaint Chief Complaint  Patient presents with   Sore Throat    Entered by patient    HPI Rachel Oliver is a 33 y.o. female.   Patient presents for evaluation of nasal congestion, rhinorrhea, nonproductive cough and a sore throat present for 2 days.  No known sick contacts.  Has not attempted treatment.  Tolerating food and liquids.  Denies fever, shortness of breath or wheezing.  Past Medical History:  Diagnosis Date   ADHD    First degree perineal laceration during delivery 04/23/2016   Medical history non-contributory    Postpartum care following vaginal delivery (1/13) 04/23/2016   Postpartum care following vaginal delivery (1/14) 04/23/2016    Patient Active Problem List   Diagnosis Date Noted   First degree perineal laceration 12/05/2018   Encounter for planned induction of labor 12/04/2018   SVD  10/31/2017   Postpartum state 04/23/2016   Postpartum care following vaginal delivery  (8/26) 04/23/2016    Past Surgical History:  Procedure Laterality Date   BREAST SURGERY     augmentation   CYSTECTOMY     from throat   WISDOM TOOTH EXTRACTION      OB History     Gravida  4   Para  3   Term  3   Preterm      AB  1   Living  3      SAB      IAB  1   Ectopic      Multiple  0   Live Births  3            Home Medications    Prior to Admission medications   Medication Sig Start Date End Date Taking? Authorizing Provider  amoxicillin (AMOXIL) 500 MG capsule Take 1 capsule (500 mg total) by mouth 2 (two) times daily for 10 days. 06/28/23 07/08/23 Yes Jazzmon Prindle R, NP  amphetamine-dextroamphetamine (ADDERALL XR) 5 MG 24 hr capsule Take 5 mg by mouth every morning. 04/22/20   [provider]  azithromycin (ZITHROMAX) 250 MG tablet Take 1 tablet (250 mg total) by mouth daily. Take first 2 tablets together, then 1 every day until  finished. 02/12/23   Luxe Cuadros, Elita Boone, NP  buPROPion (WELLBUTRIN SR) 150 MG 12 hr tablet Take 1 tablet (150 mg total) by mouth 2 (two) times daily. 06/05/22     buPROPion (WELLBUTRIN SR) 150 MG 12 hr tablet Take 1 tablet (150 mg total) by mouth 2 (two) times daily. 09/01/22     ibuprofen (ADVIL) 600 MG tablet Take 1 tablet (600 mg total) by mouth every 6 (six) hours. 12/05/18   Karena Addison, CNM  lisdexamfetamine (VYVANSE) 20 MG capsule Take 2 capsules by mouth daily 06/12/22     lisdexamfetamine (VYVANSE) 30 MG capsule Take 1 capsule (30 mg total) by mouth daily. 05/15/22     lisdexamfetamine (VYVANSE) 40 MG capsule Take 1 capsule (40 mg total) by mouth daily. 09/01/22     Methylphenidate HCl ER, PM, (JORNAY PM) 20 MG CP24 Take 1 capsule (20 mg total) by mouth every evening between 6:30PM and 9:30PM 10/16/22     sertraline (ZOLOFT) 50 MG tablet Take 1 tablet (50 mg total) by mouth daily. 12/05/18 12/05/19  Karena Addison, CNM    Family History Family History  Problem Relation Age of Onset  Healthy Mother    Healthy Father     Social History Social History   Tobacco Use   Smoking status: Never   Smokeless tobacco: Never  Vaping Use   Vaping status: Never Used  Substance Use Topics   Alcohol use: No   Drug use: No     Allergies   Patient has no known allergies.   Review of Systems Review of Systems   Physical Exam Triage Vital Signs ED Triage Vitals  Encounter Vitals Group     BP 06/28/23 1538 112/82     Systolic BP Percentile --      Diastolic BP Percentile --      Pulse Rate 06/28/23 1538 88     Resp 06/28/23 1538 16     Temp 06/28/23 1538 99 F (37.2 C)     Temp Source 06/28/23 1538 Oral     SpO2 06/28/23 1538 99 %     Weight --      Height --      Head Circumference --      Peak Flow --      Pain Score 06/28/23 1540 6     Pain Loc --      Pain Education --      Exclude from Growth Chart --    No data found.  Updated Vital Signs BP 112/82 (BP  Location: Left Arm)   Pulse 88   Temp 99 F (37.2 C) (Oral)   Resp 16   LMP 06/24/2023   SpO2 99%   Visual Acuity Right Eye Distance:   Left Eye Distance:   Bilateral Distance:    Right Eye Near:   Left Eye Near:    Bilateral Near:     Physical Exam Constitutional:      Appearance: She is well-developed.  HENT:     Right Ear: Tympanic membrane and ear canal normal.     Left Ear: Tympanic membrane and ear canal normal.     Nose: Congestion present. No rhinorrhea.     Mouth/Throat:     Pharynx: Posterior oropharyngeal erythema present.     Tonsils: No tonsillar exudate. 2+ on the right. 2+ on the left.  Cardiovascular:     Rate and Rhythm: Normal rate and regular rhythm.     Heart sounds: Normal heart sounds.  Pulmonary:     Effort: Pulmonary effort is normal.     Breath sounds: Normal breath sounds.  Musculoskeletal:     Cervical back: Normal range of motion.  Lymphadenopathy:     Cervical: Cervical adenopathy present.  Neurological:     General: No focal deficit present.     Mental Status: She is alert and oriented to person, place, and time.      UC Treatments / Results  Labs (all labs ordered are listed, but only abnormal results are displayed) Labs Reviewed  POCT RAPID STREP A (OFFICE) - Abnormal; Notable for the following components:      Result Value   Rapid Strep A Screen Positive (*)    All other components within normal limits    EKG   Radiology No results found.  Procedures Procedures (including critical care time)  Medications Ordered in UC Medications - No data to display  Initial Impression / Assessment and Plan / UC Course  I have reviewed the triage vital signs and the nursing notes.  Pertinent labs & imaging results that were available during my care of the patient were reviewed by me and considered  in my medical decision making (see chart for details).    Strep pharyngitis  Rapid testing positive, discussed with patient,  amoxicillin 10-day course prescribed, may attempt salt water gargles, throat lozenges, warm to cool liquids per preference, over-the-counter Chloraseptic spray and over-the-counter analgesics for management of discomfort, may follow-up with urgent care as needed if symptoms persist or worsen, note given  Final Clinical Impressions(s) / UC Diagnoses   Final diagnoses:  Strep pharyngitis     Discharge Instructions      Your rapid strep test today was positive  Take amoxicillin twice a day for the next 10 days, daily will see improvement in about 48 hours and steady progression from there  may use of salt gargles throat lozenges, warm liquids, teaspoons of honey and over-the-counter clippers septic spray for comfort  May give Tylenol or Motrin every 6 hours as needed for additional comfort  You may follow-up at urgent care as needed      ED Prescriptions     Medication Sig Dispense Auth. Provider   amoxicillin (AMOXIL) 500 MG capsule Take 1 capsule (500 mg total) by mouth 2 (two) times daily for 10 days. 20 capsule Valinda Hoar, NP      PDMP not reviewed this encounter.   Valinda Hoar, NP 06/28/23 [phone]

## 2023-08-05 ENCOUNTER — Ambulatory Visit
Admission: EM | Admit: 2023-08-05 | Discharge: 2023-08-05 | Disposition: A | Discharge status: Home / Self Care | Attending: Emergency Medicine | Admitting: Emergency Medicine

## 2023-08-05 ENCOUNTER — Ambulatory Visit (INDEPENDENT_AMBULATORY_CARE_PROVIDER_SITE_OTHER)

## 2023-08-05 DIAGNOSIS — R0602 Shortness of breath: Secondary | ICD-10-CM

## 2023-08-05 LAB — POCT RAPID STREP A (OFFICE): Rapid Strep A Screen: NEGATIVE

## 2023-08-05 MED ORDER — AMOXICILLIN-POT CLAVULANATE 875-125 MG PO TABS: 1.0000 | ORAL_TABLET | Freq: Two times a day (BID) | ORAL | 0 refills | Status: DC

## 2023-08-05 MED ORDER — PREDNISONE 10 MG (21) PO TBPK: ORAL_TABLET | Freq: Every day | ORAL | 0 refills | Status: DC

## 2023-08-05 NOTE — ED Triage Notes (Signed)
 Patient presents to UC for sore throat, cough, SOB x 12 hrs ago. States she feels like she has pneumonia. Req strep test. No OTC meds for symptom relief.

## 2023-08-05 NOTE — Discharge Instructions (Addendum)
 chest x-ray is pending, you will be notified of results via telephone  Rapid strep test is negative  Begin Augmentin every morning and every evening for 7 days coverage of bacteria  Begin prednisone every morning with food as directed, this helps to open and relax the airway should help with shortness of breath  You may continue use of inhaler as needed  You can take Tylenol   as needed for fever reduction and pain relief.   For cough: honey 1/2 to 1 teaspoon (you can dilute the honey in water or another fluid).  You can also use guaifenesin and dextromethorphan for cough. You can use a humidifier for chest congestion and cough.  If you don't have a humidifier, you can sit in the bathroom with the hot shower running.      For sore throat: try warm salt water gargles, cepacol lozenges, throat spray, warm tea or water with lemon/honey, popsicles or ice, or OTC cold relief medicine for throat discomfort.   For congestion: take a daily anti-histamine like Zyrtec, Claritin, and a oral decongestant, such as pseudoephedrine.  You can also use Flonase 1-2 sprays in each nostril daily.   It is important to stay hydrated: drink plenty of fluids (water, gatorade/powerade/pedialyte, juices, or teas) to keep your throat moisturized and help further relieve irritation/discomfort.

## 2023-08-05 NOTE — ED Provider Notes (Signed)
 Rachel Oliver    CSN: [id number] Arrival date & time: 08/05/23  1220      History   Chief Complaint Chief Complaint  Patient presents with   Cough   Sore Throat    HPI Rachel Oliver is a 33 y.o. female.   Patient presents for evaluation of shortness of breath with exertion and nonproductive cough beginning 12 hours ago, sore throat beginning this morning.  A few days ago experience ear pain which has resolved.  Known sick contact with exposure to bronchitis/pneumonia.  Tolerating food and liquids.  Denies fever, wheezing, congestion, nausea vomiting or diarrhea.  Has not attempted treatment.  History of pneumonia in November 2024 with similar symptoms.    Past Medical History:  Diagnosis Date   ADHD    First degree perineal laceration during delivery 04/23/2016   Medical history non-contributory    Postpartum care following vaginal delivery (1/13) 04/23/2016   Postpartum care following vaginal delivery (1/14) 04/23/2016    Patient Active Problem List   Diagnosis Date Noted   First degree perineal laceration 12/05/2018   Encounter for planned induction of labor 12/04/2018   SVD  10/31/2017   Postpartum state 04/23/2016   Postpartum care following vaginal delivery  (8/26) 04/23/2016    Past Surgical History:  Procedure Laterality Date   BREAST SURGERY     augmentation   CYSTECTOMY     from throat   WISDOM TOOTH EXTRACTION      OB History     Gravida  4   Para  3   Term  3   Preterm      AB  1   Living  3      SAB      IAB  1   Ectopic      Multiple  0   Live Births  3            Home Medications    Prior to Admission medications   Medication Sig Start Date End Date Taking? Authorizing Provider  ARIPiprazole (ABILIFY) 2 MG tablet Take 2 mg by mouth daily. 04/24/23  Yes [provider]  TRINTELLIX 10 MG TABS tablet Take 10 mg by mouth daily. 07/31/23  Yes [provider]  VYLEESI 1.75 MG/0.3ML SOAJ  04/23/23  Yes  [provider]  amoxicillin -clavulanate (AUGMENTIN) 875-125 MG tablet Take 1 tablet by mouth every 12 (twelve) hours. 08/05/23   Coila Wardell, Maybelle Spatz, NP  amphetamine-dextroamphetamine (ADDERALL XR) 5 MG 24 hr capsule Take 5 mg by mouth every morning. 04/22/20   [provider]  azithromycin  (ZITHROMAX ) 250 MG tablet Take 1 tablet (250 mg total) by mouth daily. Take first 2 tablets together, then 1 every day until finished. 02/12/23   Yehonatan Grandison, Maybelle Spatz, NP  buPROPion  (WELLBUTRIN  SR) 150 MG 12 hr tablet Take 1 tablet (150 mg total) by mouth 2 (two) times daily. 06/05/22     buPROPion  (WELLBUTRIN  SR) 150 MG 12 hr tablet Take 1 tablet (150 mg total) by mouth 2 (two) times daily. 09/01/22     ibuprofen  (ADVIL ) 600 MG tablet Take 1 tablet (600 mg total) by mouth every 6 (six) hours. 12/05/18   Sigmon, Meredith C, CNM  lisdexamfetamine (VYVANSE ) 20 MG capsule Take 2 capsules by mouth daily 06/12/22     lisdexamfetamine (VYVANSE ) 30 MG capsule Take 1 capsule (30 mg total) by mouth daily. 05/15/22     lisdexamfetamine (VYVANSE ) 40 MG capsule Take 1 capsule (40 mg total) by mouth  daily. 09/01/22     LORazepam (ATIVAN) 0.5 MG tablet Take 0.5 mg by mouth 2 (two) times daily as needed.    [provider]  Methylphenidate  HCl ER, PM, (JORNAY PM ) 20 MG CP24 Take 1 capsule (20 mg total) by mouth every evening between 6:30PM and 9:30PM 10/16/22     predniSONE (STERAPRED UNI-PAK 21 TAB) 10 MG (21) TBPK tablet Take by mouth daily. Take 6 tabs by mouth daily  for 1 days, then 5 tabs for 1 days, then 4 tabs for 1 days, then 3 tabs for 1 days, 2 tabs for 1 days, then 1 tab by mouth daily for 1 days 08/05/23   Reena Canning, NP  sertraline  (ZOLOFT ) 50 MG tablet Take 1 tablet (50 mg total) by mouth daily. 12/05/18 12/05/19  Marva Sleight, CNM    Family History Family History  Problem Relation Age of Onset   Healthy Mother    Healthy Father     Social History Social History   Tobacco Use    Smoking status: Never   Smokeless tobacco: Never  Vaping Use   Vaping status: Never Used  Substance Use Topics   Alcohol use: No   Drug use: No     Allergies   Patient has no known allergies.   Review of Systems Review of Systems  Respiratory:  Positive for cough.      Physical Exam Triage Vital Signs ED Triage Vitals [08/05/23 1242]  Encounter Vitals Group     BP 122/85     Systolic BP Percentile      Diastolic BP Percentile      Pulse Rate (!) 107     Resp 16     Temp 98.8 F (37.1 C)     Temp Source Oral     SpO2 98 %     Weight      Height      Head Circumference      Peak Flow      Pain Score      Pain Loc      Pain Education      Exclude from Growth Chart    No data found.  Updated Vital Signs BP 122/85 (BP Location: Right Arm)   Pulse (!) 107   Temp 98.8 F (37.1 C) (Oral)   Resp 16   SpO2 98%   Visual Acuity Right Eye Distance:   Left Eye Distance:   Bilateral Distance:    Right Eye Near:   Left Eye Near:    Bilateral Near:     Physical Exam Constitutional:      Appearance: Normal appearance.  HENT:     Head: Normocephalic.     Right Ear: Tympanic membrane, ear canal and external ear normal.     Left Ear: Tympanic membrane, ear canal and external ear normal.     Nose: Nose normal.     Mouth/Throat:     Mouth: Mucous membranes are moist.     Pharynx: Oropharynx is clear. Posterior oropharyngeal erythema present.  Eyes:     Extraocular Movements: Extraocular movements intact.  Cardiovascular:     Rate and Rhythm: Normal rate and regular rhythm.     Pulses: Normal pulses.     Heart sounds: Normal heart sounds.  Pulmonary:     Effort: Pulmonary effort is normal.     Breath sounds: Normal breath sounds.  Musculoskeletal:     Cervical back: Normal range of motion.  Skin:  General: Skin is warm and dry.  Neurological:     Mental Status: She is alert and oriented to person, place, and time. Mental status is at baseline.       UC Treatments / Results  Labs (all labs ordered are listed, but only abnormal results are displayed) Labs Reviewed  POCT RAPID STREP A (OFFICE) - Normal    EKG   Radiology No results found.  Procedures Procedures (including critical care time)  Medications Ordered in UC Medications - No data to display  Initial Impression / Assessment and Plan / UC Course  I have reviewed the triage vital signs and the nursing notes.  Pertinent labs & imaging results that were available during my care of the patient were reviewed by me and considered in my medical decision making (see chart for details).  Shortness of breath  Tachycardia at 107 noted in triage, no fever or further abnormality noted within the vital signs, patient in no signs of distress nontoxic-appearing, stable for outpatient management, rapid strep test is negative, chest x-ray pending, had similar symptoms approximately 5 months ago we will prophylactically place on antibiotics today, Augmentin and prednisone prescribed, has availability of inhaler at home, recommended additional over-the-counter medication and nonpharmacological supportive care for any additional symptoms, advised follow-up as needed Final Clinical Impressions(s) / UC Diagnoses   Final diagnoses:  Shortness of breath     Discharge Instructions      chest x-ray is pending, you will be notified of results via telephone  Rapid strep test is negative  Begin Augmentin every morning and every evening for 7 days coverage of bacteria  Begin prednisone every morning with food as directed, this helps to open and relax the airway should help with shortness of breath  You may continue use of inhaler as needed  You can take Tylenol   as needed for fever reduction and pain relief.   For cough: honey 1/2 to 1 teaspoon (you can dilute the honey in water or another fluid).  You can also use guaifenesin and dextromethorphan for cough. You can use a  humidifier for chest congestion and cough.  If you don't have a humidifier, you can sit in the bathroom with the hot shower running.      For sore throat: try warm salt water gargles, cepacol lozenges, throat spray, warm tea or water with lemon/honey, popsicles or ice, or OTC cold relief medicine for throat discomfort.   For congestion: take a daily anti-histamine like Zyrtec, Claritin, and a oral decongestant, such as pseudoephedrine.  You can also use Flonase 1-2 sprays in each nostril daily.   It is important to stay hydrated: drink plenty of fluids (water, gatorade/powerade/pedialyte, juices, or teas) to keep your throat moisturized and help further relieve irritation/discomfort.    ED Prescriptions     Medication Sig Dispense Auth. Provider   amoxicillin -clavulanate (AUGMENTIN) 875-125 MG tablet  (Status: Discontinued) Take 1 tablet by mouth every 12 (twelve) hours. 14 tablet Marshawn Normoyle R, NP   predniSONE (STERAPRED UNI-PAK 21 TAB) 10 MG (21) TBPK tablet  (Status: Discontinued) Take by mouth daily. Take 6 tabs by mouth daily  for 1 days, then 5 tabs for 1 days, then 4 tabs for 1 days, then 3 tabs for 1 days, 2 tabs for 1 days, then 1 tab by mouth daily for 1 days 21 tablet Rickardo Brinegar R, NP   amoxicillin -clavulanate (AUGMENTIN) 875-125 MG tablet Take 1 tablet by mouth every 12 (twelve) hours. 14 tablet [place], Jacques Willingham  R, NP   predniSONE (STERAPRED UNI-PAK 21 TAB) 10 MG (21) TBPK tablet Take by mouth daily. Take 6 tabs by mouth daily  for 1 days, then 5 tabs for 1 days, then 4 tabs for 1 days, then 3 tabs for 1 days, 2 tabs for 1 days, then 1 tab by mouth daily for 1 days 21 tablet Makiya Jeune, Maybelle Spatz, NP      PDMP not reviewed this encounter.   Reena Canning, NP 08/05/23 1338

## 2023-08-06 ENCOUNTER — Ambulatory Visit: Admitting: Nurse Practitioner

## 2023-09-24 ENCOUNTER — Ambulatory Visit: Admitting: Nurse Practitioner

## 2023-10-22 ENCOUNTER — Ambulatory Visit: Admitting: Nurse Practitioner

## 2023-10-22 VITALS — BP 124/80 | HR 70 | Temp 98.2°F | Ht 65.0 in | Wt 169.8 lb

## 2023-10-22 DIAGNOSIS — Z1159 Encounter for screening for other viral diseases: Secondary | ICD-10-CM | POA: Diagnosis not present

## 2023-10-22 DIAGNOSIS — Z0000 Encounter for general adult medical examination without abnormal findings: Secondary | ICD-10-CM | POA: Diagnosis not present

## 2023-10-22 DIAGNOSIS — Z111 Encounter for screening for respiratory tuberculosis: Secondary | ICD-10-CM

## 2023-10-22 DIAGNOSIS — F909 Attention-deficit hyperactivity disorder, unspecified type: Secondary | ICD-10-CM | POA: Insufficient documentation

## 2023-10-22 DIAGNOSIS — Z13220 Encounter for screening for lipoid disorders: Secondary | ICD-10-CM | POA: Diagnosis not present

## 2023-10-22 DIAGNOSIS — Z131 Encounter for screening for diabetes mellitus: Secondary | ICD-10-CM | POA: Diagnosis not present

## 2023-10-22 LAB — LIPID PANEL
Cholesterol: 133 mg/dL (ref 0–200)
HDL: 66.9 mg/dL (ref >39.00)
LDL Cholesterol: 58 mg/dL (ref 0–99)
NonHDL: 66.4
Total CHOL/HDL Ratio: 2
Triglycerides: 41 mg/dL (ref 0.0–149.0)
VLDL: 8.2 mg/dL (ref 0.0–40.0)

## 2023-10-22 LAB — COMPREHENSIVE METABOLIC PANEL WITH GFR
ALT: 19 U/L (ref 0–35)
AST: 32 U/L (ref 0–37)
Albumin: 4.3 g/dL (ref 3.5–5.2)
Alkaline Phosphatase: 60 U/L (ref 39–117)
BUN: 20 mg/dL (ref 6–23)
CO2: 29 meq/L (ref 19–32)
Calcium: 9.3 mg/dL (ref 8.4–10.5)
Chloride: 101 meq/L (ref 96–112)
Creatinine, Ser: 0.9 mg/dL (ref 0.40–1.20)
GFR: 84.16 mL/min (ref >60.00)
Glucose, Bld: 81 mg/dL (ref 70–99)
Potassium: 5 meq/L (ref 3.5–5.1)
Sodium: 137 meq/L (ref 135–145)
Total Bilirubin: 0.6 mg/dL (ref 0.2–1.2)
Total Protein: 7.1 g/dL (ref 6.0–8.3)

## 2023-10-22 LAB — CBC
HCT: 40.6 % (ref 36.0–46.0)
Hemoglobin: 13.5 g/dL (ref 12.0–15.0)
MCHC: 33.4 g/dL (ref 30.0–36.0)
MCV: 88.1 fl (ref 78.0–100.0)
Platelets: 217 K/uL (ref 150.0–400.0)
RBC: 4.6 Mil/uL (ref 3.87–5.11)
RDW: 13.5 % (ref 11.5–15.5)
WBC: 7.1 K/uL (ref 4.0–10.5)

## 2023-10-22 LAB — HEMOGLOBIN A1C: Hgb A1c MFr Bld: 5.8 % (ref 4.6–6.5)

## 2023-10-22 LAB — TSH: TSH: 0.63 u[IU]/mL (ref 0.35–5.50)

## 2023-10-22 NOTE — Patient Instructions (Signed)
 Nice to see you today I will be in touch with the labs once I have reviewed them Follow up with me in 1 year, sooner if you need me

## 2023-10-22 NOTE — Assessment & Plan Note (Signed)
 Discussed age-appropriate immunizations and screening exams.  Did review patient's personal, surgical, social, family histories.  Patient is up-to-date with all age-appropriate vaccinations she would like.  Patient is too young for CRC screening.  Patient up-to-date on cervical cancer screening.  Patient is too young for breast cancer screening.  Patient was given information at discharge about preventative healthcare maintenance with anticipatory guidance.

## 2023-10-22 NOTE — Assessment & Plan Note (Signed)
 Patient currently followed by behavioral health Lauren Blankenship.  Patient currently on Jornay 20 mg daily.  Continue taking medication as prescribed follow-up with specialist as recommended

## 2023-10-22 NOTE — Progress Notes (Signed)
 New Patient Office Visit  Subjective    Patient ID: Rachel Oliver, female    DOB: June 21, 1990  Age: 33 y.o. MRN: [id number]  CC:  Chief Complaint  Patient presents with   Establish Care    General check up. Pt is seen by GYN.     HPI Rachel Oliver presents to establish care  ADD/ADHD: patient currently on Jornay and followed by lauren Blankenship.  for complete physical and follow up of chronic conditions.  Immunizations: -Tetanus: Completed in within 10 years  -Influenza: out of season  -Shingles: too young  -Pneumonia: too young -HPV: up to date   Diet: Fair diet. 3 meals and some snacks. She does drink coffee and water  Exercise:  She will cardio 5 days a week and weight 3-4 days    Eye exam: Lasix. Dental exam: Completes semi-annually    Colonoscopy: Too young, currently average risk Lung Cancer Screening: N/A  Pap Smear: Was seen By Dr Gorge and will stay at [facility]. April 2025  Mammogram: Too young, currently average risk  Dexa: Too young  Sleep: states that she will work days and nights. Goes to bed aroun 11 and get up aroudn 5-6. Feels rested most of the time   STI: Does not need   She is planning on going to nurisng school at [employer] and needs TB screening     Outpatient Encounter Medications as of 10/22/2023  Medication Sig   Methylphenidate  HCl ER, PM, (JORNAY PM ) 20 MG CP24 Take 1 capsule (20 mg total) by mouth every evening between 6:30PM and 9:30PM   [DISCONTINUED] desvenlafaxine (PRISTIQ) 25 MG 24 hr tablet Take 25 mg by mouth daily.   [DISCONTINUED] LORazepam (ATIVAN) 0.5 MG tablet Take 0.5 mg by mouth 2 (two) times daily as needed.   [DISCONTINUED] amoxicillin -clavulanate (AUGMENTIN ) 875-125 MG tablet Take 1 tablet by mouth every 12 (twelve) hours.   [DISCONTINUED] amphetamine-dextroamphetamine (ADDERALL XR) 5 MG 24 hr capsule Take 5 mg by mouth every morning.   [DISCONTINUED] ARIPiprazole (ABILIFY) 2 MG tablet Take 2 mg by mouth daily.    [DISCONTINUED] azithromycin  (ZITHROMAX ) 250 MG tablet Take 1 tablet (250 mg total) by mouth daily. Take first 2 tablets together, then 1 every day until finished.   [DISCONTINUED] buPROPion  (WELLBUTRIN  SR) 150 MG 12 hr tablet Take 1 tablet (150 mg total) by mouth 2 (two) times daily.   [DISCONTINUED] buPROPion  (WELLBUTRIN  SR) 150 MG 12 hr tablet Take 1 tablet (150 mg total) by mouth 2 (two) times daily.   [DISCONTINUED] ibuprofen  (ADVIL ) 600 MG tablet Take 1 tablet (600 mg total) by mouth every 6 (six) hours.   [DISCONTINUED] lisdexamfetamine (VYVANSE ) 20 MG capsule Take 2 capsules by mouth daily   [DISCONTINUED] lisdexamfetamine (VYVANSE ) 30 MG capsule Take 1 capsule (30 mg total) by mouth daily.   [DISCONTINUED] lisdexamfetamine (VYVANSE ) 40 MG capsule Take 1 capsule (40 mg total) by mouth daily.   [DISCONTINUED] predniSONE  (STERAPRED UNI-PAK 21 TAB) 10 MG (21) TBPK tablet Take by mouth daily. Take 6 tabs by mouth daily  for 1 days, then 5 tabs for 1 days, then 4 tabs for 1 days, then 3 tabs for 1 days, 2 tabs for 1 days, then 1 tab by mouth daily for 1 days   [DISCONTINUED] sertraline  (ZOLOFT ) 50 MG tablet Take 1 tablet (50 mg total) by mouth daily.   [DISCONTINUED] TRINTELLIX 10 MG TABS tablet Take 10 mg by mouth daily.   [DISCONTINUED] VYLEESI 1.75 MG/0.3ML SOAJ  No facility-administered encounter medications on file as of 10/22/2023.    Past Medical History:  Diagnosis Date   ADHD    Anxiety    First degree perineal laceration during delivery 04/23/2016   Medical history non-contributory    Postpartum care following vaginal delivery (1/13) 04/23/2016   Postpartum care following vaginal delivery (1/14) 04/23/2016    Past Surgical History:  Procedure Laterality Date   BREAST SURGERY     augmentation   COSMETIC SURGERY     CYSTECTOMY     from throat   WISDOM TOOTH EXTRACTION      Family History  Problem Relation Age of Onset   Healthy Mother    COPD Father     Social  History   Socioeconomic History   Marital status: Married    Spouse name: Carlin   Number of children: 3   Years of education: Not on file   Highest education level: Bachelor's degree (e.g., BA, AB, BS)  Occupational History   Not on file  Tobacco Use   Smoking status: Never   Smokeless tobacco: Never  Vaping Use   Vaping status: Never Used  Substance and Sexual Activity   Alcohol use: No    Comment: occ   Drug use: No   Sexual activity: Yes    Birth control/protection: None  Other Topics Concern   Not on file  Social History Narrative   Fulltime: Psychologist, sport and exercise [employer]      CJ (7)   Emma (6)   Addy (5)   Social Drivers of Corporate investment banker Strain: Low Risk  (10/22/2023)   Overall Financial Resource Strain (CARDIA)    Difficulty of Paying Living Expenses: Not hard at all  Food Insecurity: No Food Insecurity (10/22/2023)   Hunger Vital Sign    Worried About Running Out of Food in the Last Year: Never true    Ran Out of Food in the Last Year: Never true  Transportation Needs: No Transportation Needs (10/22/2023)   PRAPARE - Administrator, Civil Service (Medical): No    Lack of Transportation (Non-Medical): No  Physical Activity: Sufficiently Active (10/22/2023)   Exercise Vital Sign    Days of Exercise per Week: 5 days    Minutes of Exercise per Session: 60 min  Stress: No Stress Concern Present (10/22/2023)   [employer] of Occupational Health - Occupational Stress Questionnaire    Feeling of Stress: Only a little  Social Connections: Unknown (10/22/2023)   Social Connection and Isolation Panel    Frequency of Communication with Friends and Family: More than three times a week    Frequency of Social Gatherings with Friends and Family: More than three times a week    Attends Religious Services: More than 4 times per year    Active Member of [employer] or Organizations: Yes    Attends Banker Meetings: More than 4 times per year    Marital  Status: Patient declined  Intimate Partner Violence: Not At Risk (10/11/2022)   Received from [facility]   HITS    Over the last 12 months how often did your partner physically hurt you?: Never    Over the last 12 months how often did your partner insult you or talk down to you?: Never    Over the last 12 months how often did your partner threaten you with physical harm?: Never    Over the last 12 months how often did your partner scream or  curse at you?: Never    Review of Systems  Constitutional:  Negative for chills and fever.  Respiratory:  Negative for shortness of breath.   Cardiovascular:  Negative for chest pain and leg swelling.  Gastrointestinal:  Negative for abdominal pain, blood in stool, constipation, diarrhea, nausea and vomiting.       Bm daily   Genitourinary:  Negative for dysuria and hematuria.  Neurological:  Negative for dizziness, tingling and headaches.  Psychiatric/Behavioral:  Negative for hallucinations and suicidal ideas.         Objective    BP 124/80   Pulse 70   Temp 98.2 F (36.8 C) (Oral)   Ht 5' 5 (1.651 m)   Wt 169 lb 12.8 oz (77 kg)   LMP 10/03/2023 (Exact Date)   SpO2 100%   BMI 28.26 kg/m   Physical Exam Vitals and nursing note reviewed.  Constitutional:      Appearance: Normal appearance.  HENT:     Right Ear: Tympanic membrane, ear canal and external ear normal.     Left Ear: Tympanic membrane, ear canal and external ear normal.     Mouth/Throat:     Mouth: Mucous membranes are moist.     Pharynx: Oropharynx is clear.  Eyes:     Extraocular Movements: Extraocular movements intact.     Pupils: Pupils are equal, round, and reactive to light.  Cardiovascular:     Rate and Rhythm: Normal rate and regular rhythm.     Pulses: Normal pulses.     Heart sounds: Normal heart sounds.  Pulmonary:     Effort: Pulmonary effort is normal.     Breath sounds: Normal breath sounds.  Abdominal:     General: Bowel sounds are normal. There  is no distension.     Palpations: There is no mass.     Tenderness: There is no abdominal tenderness.     Hernia: No hernia is present.  Musculoskeletal:     Right lower leg: No edema.     Left lower leg: No edema.  Lymphadenopathy:     Cervical: No cervical adenopathy.  Skin:    General: Skin is warm.  Neurological:     General: No focal deficit present.     Mental Status: She is alert.     Deep Tendon Reflexes:     Reflex Scores:      Bicep reflexes are 2+ on the right side and 2+ on the left side.      Patellar reflexes are 2+ on the right side and 2+ on the left side.    Comments: Bilateral upper and lower extremity strength 5/5  Psychiatric:        Mood and Affect: Mood normal.        Behavior: Behavior normal.        Thought Content: Thought content normal.        Judgment: Judgment normal.         Assessment & Plan:   Problem List Items Addressed This Visit       Other   Preventative health care - Primary   Discussed age-appropriate immunizations and screening exams.  Did review patient's personal, surgical, social, family histories.  Patient is up-to-date with all age-appropriate vaccinations she would like.  Patient is too young for CRC screening.  Patient up-to-date on cervical cancer screening.  Patient is too young for breast cancer screening.  Patient was given information at discharge about preventative healthcare maintenance with anticipatory guidance.  Relevant Orders   CBC   Comprehensive metabolic panel with GFR   TSH   Attention deficit hyperactivity disorder (ADHD)   Patient currently followed by behavioral health Lauren Blankenship.  Patient currently on Jornay 20 mg daily.  Continue taking medication as prescribed follow-up with specialist as recommended      Other Visit Diagnoses       Encounter for hepatitis C screening test for low risk patient       Relevant Orders   Hepatitis C antibody     Screening for lipid disorders        Relevant Orders   Lipid panel     Screening for diabetes mellitus       Relevant Orders   Hemoglobin A1c     Screening for tuberculosis       Relevant Orders   QuantiFERON-TB Gold Plus       Return in about 1 year (around 10/21/2024) for CPE and Labs.   Adina Crandall, NP

## 2023-10-23 LAB — HEPATITIS C ANTIBODY: Hepatitis C Ab: NONREACTIVE

## 2023-10-24 ENCOUNTER — Ambulatory Visit: Payer: Self-pay | Admitting: Nurse Practitioner

## 2023-10-24 DIAGNOSIS — R7303 Prediabetes: Secondary | ICD-10-CM

## 2023-10-24 LAB — QUANTIFERON-TB GOLD PLUS
Mitogen-NIL: >10 [IU]/mL
NIL: 0.02 [IU]/mL
QuantiFERON-TB Gold Plus: NEGATIVE
TB1-NIL: 0 [IU]/mL
TB2-NIL: 0 [IU]/mL

## 2023-10-25 NOTE — Telephone Encounter (Signed)
 Last read by Nestora Hurst at 5:16PM on 10/24/2023.

## 2023-11-07 ENCOUNTER — Ambulatory Visit: Admitting: Nurse Practitioner

## 2023-11-20 ENCOUNTER — Ambulatory Visit: Admission: EM | Admit: 2023-11-20 | Discharge: 2023-11-20 | Disposition: A | Discharge status: Home / Self Care

## 2023-11-20 ENCOUNTER — Ambulatory Visit

## 2023-11-20 ENCOUNTER — Encounter: Payer: Self-pay | Admitting: Emergency Medicine

## 2023-11-20 DIAGNOSIS — J020 Streptococcal pharyngitis: Secondary | ICD-10-CM | POA: Insufficient documentation

## 2023-11-20 LAB — GROUP A STREP BY PCR: Group A Strep by PCR: DETECTED — AB

## 2023-11-20 MED ORDER — PREDNISONE 20 MG PO TABS: 40.0000 mg | ORAL_TABLET | Freq: Every day | ORAL | 0 refills | Status: AC

## 2023-11-20 MED ORDER — AMOXICILLIN 500 MG PO CAPS: 500.0000 mg | ORAL_CAPSULE | Freq: Two times a day (BID) | ORAL | 0 refills | Status: AC

## 2023-11-20 NOTE — ED Provider Notes (Signed)
 [facility]  Note:  This document was prepared using Conservation officer, historic buildings and may include unintentional dictation errors.  MRN: [id number] DOB: [birth year]  Subjective:   Rachel Oliver is a 33 y.o. female presenting for severe sore throat since yesterday.  Patient reports she has used over-the-counter sore throat lozenges with minimal improvement to symptoms.  Patient denies taking any other over-the-counter medication.  Denies any known sick contacts but states that she has been around a full classroom of kindergartners which may be the source of infection.  Denies cough, nasal congestion, body aches, fever, shortness of breath, chest pain.  No current facility-administered medications for this encounter.  Current Outpatient Medications:    amoxicillin  (AMOXIL ) 500 MG capsule, Take 1 capsule (500 mg total) by mouth 2 (two) times daily for 10 days., Disp: 20 capsule, Rfl: 0   predniSONE  (DELTASONE ) 20 MG tablet, Take 2 tablets (40 mg total) by mouth daily for 5 days., Disp: 10 tablet, Rfl: 0   Methylphenidate  HCl ER, PM, (JORNAY PM ) 20 MG CP24, Take 1 capsule (20 mg total) by mouth every evening between 6:30PM and 9:30PM, Disp: 30 capsule, Rfl: 0   No Known Allergies  Past Medical History:  Diagnosis Date   ADHD    Anxiety    First degree perineal laceration during delivery 04/23/2016   Medical history non-contributory    Postpartum care following vaginal delivery (1/13) 04/23/2016   Postpartum care following vaginal delivery (1/14) 04/23/2016     Past Surgical History:  Procedure Laterality Date   BREAST SURGERY     augmentation   COSMETIC SURGERY     CYSTECTOMY     from throat   LASIK     WISDOM TOOTH EXTRACTION      Family History  Problem Relation Age of Onset   Healthy Mother    COPD Father     Social History   Tobacco Use   Smoking status: Never   Smokeless tobacco: Never  Vaping Use   Vaping status: Never Used  Substance Use Topics    Alcohol use: Yes    Comment: occ   Drug use: No    ROS Refer to HPI for ROS details.  Objective:   Vitals: BP 117/74 (BP Location: Right Arm)   Pulse 91   Temp 99.2 F (37.3 C) (Oral)   Resp 18   Wt 169 lb (76.7 kg)   LMP 11/06/2023 (Exact Date)   SpO2 97%   BMI 28.12 kg/m   Physical Exam Vitals and nursing note reviewed.  Constitutional:      General: She is not in acute distress.    Appearance: Normal appearance. She is well-developed. She is not ill-appearing or toxic-appearing.  HENT:     Head: Normocephalic and atraumatic.     Nose: Nose normal. No congestion or rhinorrhea.     Mouth/Throat:     Mouth: Mucous membranes are moist.     Pharynx: Oropharyngeal exudate and posterior oropharyngeal erythema present.     Tonsils: Tonsillar exudate present.  Eyes:     General:        Right eye: No discharge.        Left eye: No discharge.     Extraocular Movements: Extraocular movements intact.     Conjunctiva/sclera: Conjunctivae normal.  Cardiovascular:     Rate and Rhythm: Normal rate.  Pulmonary:     Effort: Pulmonary effort is normal. No respiratory distress.  Musculoskeletal:        General:  Normal range of motion.  Skin:    General: Skin is warm and dry.  Neurological:     General: No focal deficit present.     Mental Status: She is alert and oriented to person, place, and time.  Psychiatric:        Mood and Affect: Mood normal.        Behavior: Behavior normal.     Procedures  Results for orders placed or performed during the hospital encounter of 11/20/23 (from the past 24 hours)  Group A Strep by PCR     Status: Abnormal   Collection Time: 11/20/23  8:57 AM   Specimen: Throat; Sterile Swab  Result Value Ref Range   Group A Strep by PCR DETECTED (A) NOT DETECTED    Assessment and Plan :     Discharge Instructions       1. Strep pharyngitis (Primary) - Group A Strep by PCR completed today in UC is positive for strep pharyngitis -  predniSONE  (DELTASONE ) 20 MG tablet; Take 2 tablets (40 mg total) by mouth daily for 5 days.  Dispense: 10 tablet; Refill: 0 - amoxicillin  (AMOXIL ) 500 MG capsule; Take 1 capsule (500 mg total) by mouth 2 (two) times daily for 10 days.  Dispense: 20 capsule; Refill: 0 -Continue to monitor symptoms for any change in severity if there is any escalation of current symptoms or development of new symptoms follow-up in ER for further evaluation and management.      Rachel Oliver   Rachel Oliver, [place], [place] 11/20/23 [phone]

## 2023-11-20 NOTE — Discharge Instructions (Signed)
  1. Strep pharyngitis (Primary) - Group A Strep by PCR completed today in UC is positive for strep pharyngitis - predniSONE  (DELTASONE ) 20 MG tablet; Take 2 tablets (40 mg total) by mouth daily for 5 days.  Dispense: 10 tablet; Refill: 0 - amoxicillin  (AMOXIL ) 500 MG capsule; Take 1 capsule (500 mg total) by mouth 2 (two) times daily for 10 days.  Dispense: 20 capsule; Refill: 0 -Continue to monitor symptoms for any change in severity if there is any escalation of current symptoms or development of new symptoms follow-up in ER for further evaluation and management.

## 2023-11-20 NOTE — ED Triage Notes (Signed)
 Pt c/o sore throat that started yesterday. She tried throat lozenges for her symptoms.

## 2023-12-10 ENCOUNTER — Telehealth: Admitting: Physician Assistant

## 2023-12-10 DIAGNOSIS — R3989 Other symptoms and signs involving the genitourinary system: Secondary | ICD-10-CM | POA: Diagnosis not present

## 2023-12-11 MED ORDER — NITROFURANTOIN MONOHYD MACRO 100 MG PO CAPS
100.0000 mg | ORAL_CAPSULE | Freq: Two times a day (BID) | ORAL | 0 refills | Status: AC
Start: 2023-12-11 — End: ?

## 2023-12-11 NOTE — Progress Notes (Signed)
 I have spent 5 minutes in review of e-visit questionnaire, review and updating patient chart, medical decision making and response to patient.   Elsie Velma Lunger, PA-C

## 2023-12-11 NOTE — Progress Notes (Signed)

## 2024-01-07 ENCOUNTER — Telehealth: Admitting: Family Medicine

## 2024-01-07 DIAGNOSIS — J069 Acute upper respiratory infection, unspecified: Secondary | ICD-10-CM | POA: Diagnosis not present

## 2024-01-07 MED ORDER — IPRATROPIUM BROMIDE 0.03 % NA SOLN
2.0000 | Freq: Two times a day (BID) | NASAL | 0 refills | Status: AC
Start: 2024-01-07 — End: ?

## 2024-01-07 NOTE — Progress Notes (Signed)
 E-Visit for Upper Respiratory Infection   We are sorry you are not feeling well.  Here is how we plan to help!  Based on what you have shared with me, it looks like you may have a viral upper respiratory infection.  Upper respiratory infections are caused by a large number of viruses; however, rhinovirus is the most common cause.   Symptoms vary from person to person, with common symptoms including sore throat, cough, fatigue or lack of energy and feeling of general discomfort.  A low-grade fever of up to 100.4 may present, but is often uncommon.  Symptoms vary however, and are closely related to a person's age or underlying illnesses.  The most common symptoms associated with an upper respiratory infection are nasal discharge or congestion, cough, sneezing, headache and pressure in the ears and face.  These symptoms usually persist for about 3 to 10 days, but can last up to 2 weeks.  It is important to know that upper respiratory infections do not cause serious illness or complications in most cases.    Upper respiratory infections can be transmitted from person to person, with the most common method of transmission being a person's hands.  The virus is able to live on the skin and can infect other persons for up to 2 hours after direct contact.  Also, these can be transmitted when someone coughs or sneezes; thus, it is important to cover the mouth to reduce this risk.  To keep the spread of the illness at bay, good hand hygiene is very important.  This is an infection that is most likely caused by a virus. There are no specific treatments other than to help you with the symptoms until the infection runs its course.  We are sorry you are not feeling well.  Here is how we plan to help!   For nasal congestion, you may use an oral decongestants such as Mucinex D or if you have glaucoma or high blood pressure use plain Mucinex- continue to use this.  Saline nasal spray or nasal drops can help and can  safely be used as often as needed for congestion.  For your congestion, I have prescribed Ipratropium Bromide nasal spray 0.03% two sprays in each nostril 2-3 times a day   Use some saline spray- and try to not over blow your nose or too forcefully- brown snot can mean dried blood from blowing too hard.  If you do not have a history of heart disease, hypertension, diabetes or thyroid  disease, prostate/bladder issues or glaucoma, you may also use Sudafed to treat nasal congestion.  It is highly recommended that you consult with a pharmacist or your primary care physician to ensure this medication is safe for you to take.     If you have a cough, you may use cough suppressants such as Delsym and Robitussin.    If you have a sore or scratchy throat, use a saltwater gargle-  to  teaspoon of salt dissolved in a 4-ounce to 8-ounce glass of warm water.  Gargle the solution for approximately 15-30 seconds and then spit.  It is important not to swallow the solution.  You can also use throat lozenges/cough drops and Chloraseptic spray to help with throat pain or discomfort.  Warm or cold liquids can also be helpful in relieving throat pain.  For headache, pain or general discomfort, you can use Ibuprofen  or Tylenol  as directed.   Some authorities believe that zinc sprays or the use of Echinacea may  shorten the course of your symptoms.   HOME CARE Only take medications as instructed by your medical team. Be sure to drink plenty of fluids. Water is fine as well as fruit juices, sodas and electrolyte beverages. You may want to stay away from caffeine or alcohol. If you are nauseated, try taking small sips of liquids. How do you know if you are getting enough fluid? Your urine should be a pale yellow or almost colorless. Get rest. Taking a steamy shower or using a humidifier may help nasal congestion and ease sore throat pain. You can place a towel over your head and breathe in the steam from hot water coming  from a faucet. Using a saline nasal spray works much the same way. Cough drops, hard candies and sore throat lozenges may ease your cough. Avoid close contacts especially the very young and the elderly Cover your mouth if you cough or sneeze Always remember to wash your hands.   GET HELP RIGHT AWAY IF: You develop worsening fever. If your symptoms do not improve within 10 days You develop yellow or green discharge from your nose over 3 days. You have coughing fits You develop a severe head ache or visual changes. You develop shortness of breath, difficulty breathing or start having chest pain Your symptoms persist after you have completed your treatment plan  MAKE SURE YOU  Understand these instructions. Will watch your condition. Will get help right away if you are not doing well or get worse.  Thank you for choosing an e-visit.  Your e-visit answers were reviewed by a board certified advanced clinical practitioner to complete your personal care plan. Depending upon the condition, your plan could have included both over the counter or prescription medications.  Please review your pharmacy choice. Make sure the pharmacy is open so you can pick up prescription now. If there is a problem, you may contact your provider through [employer] and have the prescription routed to another pharmacy.  Your safety is important to us . If you have drug allergies check your prescription carefully.   For the next 24 hours you can use MyChart to ask questions about today's visit, request a non-urgent call back, or ask for a work or school excuse. You will get an email in the next two days asking about your experience. I hope that your e-visit has been valuable and will speed your recovery.    I provided 5 minutes of non face-to-face time during this encounter for chart review, medication and order placement, as well as and documentation.

## 2024-03-02 ENCOUNTER — Telehealth: Admitting: Nurse Practitioner

## 2024-03-02 DIAGNOSIS — K0889 Other specified disorders of teeth and supporting structures: Secondary | ICD-10-CM

## 2024-03-02 MED ORDER — IBUPROFEN 800 MG PO TABS: 800.0000 mg | ORAL_TABLET | Freq: Three times a day (TID) | ORAL | 0 refills | Status: AC | PRN

## 2024-03-02 NOTE — Progress Notes (Signed)
 E-Visit for Dental Pain  We are sorry that you are not feeling well.  Here is how we plan to help!  Based on what you have shared with me in the questionnaire I have sent prescription strength motrin  800 mg .    It is imperative that you see a dentist within 10 days of this eVisit to determine the cause of the dental pain and be sure it is adequately treated  A toothache or tooth pain is caused when the nerve in the root of a tooth or surrounding a tooth is irritated. Dental (tooth) infection, decay, injury, or loss of a tooth are the most common causes of dental pain. Pain may also occur after an extraction (tooth is pulled out). Pain sometimes originates from other areas and radiates to the jaw, thus appearing to be tooth pain.Bacteria growing inside your mouth can contribute to gum disease and dental decay, both of which can cause pain. A toothache occurs from inflammation of the central portion of the tooth called pulp. The pulp contains nerve endings that are very sensitive to pain. Inflammation to the pulp or pulpitis may be caused by dental cavities, trauma, and infection.    HOME CARE:   For toothaches: Over-the-counter pain medications such as acetaminophen  or ibuprofen  may be used. Take these as directed on the package while you arrange for a dental appointment. Avoid very cold or hot foods, because they may make the pain worse. You may get relief from biting on a cotton ball soaked in oil of cloves. You can get oil of cloves at most drug stores.  For jaw pain:  Aspirin may be helpful for problems in the joint of the jaw in adults. If pain happens every time you open your mouth widely, the temporomandibular joint (TMJ) may be the source of the pain. Yawning or taking a large bite of food may worsen the pain. An appointment with your doctor or dentist will help you find the cause.     GET HELP RIGHT AWAY IF:  You have a high fever or chills If you have had a recent head or face  injury and develop headache, light headedness, nausea, vomiting, or other symptoms that concern you after an injury to your face or mouth, you could have a more serious injury in addition to your dental injury. A facial rash associated with a toothache: This condition may improve with medication. Contact your doctor for them to decide what is appropriate. Any jaw pain occurring with chest pain: Although jaw pain is most commonly caused by dental disease, it is sometimes referred pain from other areas. People with heart disease, especially people who have had stents placed, people with diabetes, or those who have had heart surgery may have jaw pain as a symptom of heart attack or angina. If your jaw or tooth pain is associated with lightheadedness, sweating, or shortness of breath, you should see a doctor as soon as possible. Trouble swallowing or excessive pain or bleeding from gums: If you have a history of a weakened immune system, diabetes, or steroid use, you may be more susceptible to infections. Infections can often be more severe and extensive or caused by unusual organisms. Dental and gum infections in people with these conditions may require more aggressive treatment. An abscess may need draining or IV antibiotics, for example.  MAKE SURE YOU   Understand these instructions. Will watch your condition. Will get help right away if you are not doing well or get worse.  Thank you for choosing an e-visit.  Your e-visit answers were reviewed by a board certified advanced clinical practitioner to complete your personal care plan. Depending upon the condition, your plan could have included both over the counter or prescription medications.  Please review your pharmacy choice. Make sure the pharmacy is open so you can pick up prescription now. If there is a problem, you may contact your provider through [employer] and have the prescription routed to another pharmacy.  Your safety is important  to us . If you have drug allergies check your prescription carefully.   For the next 24 hours you can use MyChart to ask questions about today's visit, request a non-urgent call back, or ask for a work or school excuse. You will get an email in the next two days asking about your experience. I hope that your e-visit has been valuable and will speed your recovery.  I have spent 5 minutes in review of e-visit questionnaire, review and updating patient chart, medical decision making and response to patient.   Quana Chamberlain W Magaret Justo, NP

## 2024-03-04 ENCOUNTER — Encounter: Payer: Self-pay | Admitting: Nurse Practitioner

## 2024-03-05 NOTE — Telephone Encounter (Signed)
 Thank you  With the discharge I recommend that she goes and is evaluated.  Agree with UC disposition

## 2024-03-05 NOTE — Telephone Encounter (Signed)
 I spoke with pt;  pt did not see spider but there were 2 marks near heel and redness with swelling and itching., reddened area is enlarged from initial redness noted. Area is still swollen and itchy. Also now there is oozing from the area. Pt is not sure if drainage is clear or colored. Pt does not have a fever. At this time pt does not see red streaks. No available appts at [facility] or [place] this afternoon and offices are closed until 03/10/24. Pt said later this afternoon pt will go to UC in [place]. UC & ED precautions given and pt voiced understanding. Sending note to CHRISTELLA Crandall NP.

## 2024-03-05 NOTE — Telephone Encounter (Signed)
 Can we triage and schedule appt if appropriate
